# Patient Record
Sex: Male | Born: 1970 | Race: White | Hispanic: No | Marital: Married | Smoking: Former smoker
Health system: Southern US, Community
[De-identification: ages and names within clinical notes are randomized; demographics above are authoritative.]

## PROBLEM LIST (undated history)

## (undated) DIAGNOSIS — R339 Retention of urine, unspecified: Secondary | ICD-10-CM

## (undated) DIAGNOSIS — K567 Ileus, unspecified: Secondary | ICD-10-CM

## (undated) DIAGNOSIS — K9189 Other postprocedural complications and disorders of digestive system: Secondary | ICD-10-CM

## (undated) DIAGNOSIS — Z8659 Personal history of other mental and behavioral disorders: Secondary | ICD-10-CM

## (undated) HISTORY — PX: SINOSCOPY: SHX187

## (undated) HISTORY — PX: NASAL SEPTUM SURGERY: SHX37

---

## 2004-07-29 ENCOUNTER — Ambulatory Visit: Payer: Self-pay | Admitting: Internal Medicine

## 2004-08-03 ENCOUNTER — Emergency Department (HOSPITAL_COMMUNITY): Admission: EM | Admit: 2004-08-03 | Discharge: 2004-08-03 | Payer: Self-pay

## 2004-08-05 ENCOUNTER — Ambulatory Visit: Payer: Self-pay | Admitting: Internal Medicine

## 2004-08-29 ENCOUNTER — Ambulatory Visit: Payer: Self-pay | Admitting: Internal Medicine

## 2004-09-04 ENCOUNTER — Ambulatory Visit: Payer: Self-pay | Admitting: Psychiatry

## 2004-09-04 ENCOUNTER — Other Ambulatory Visit (HOSPITAL_COMMUNITY): Admission: RE | Admit: 2004-09-04 | Discharge: 2004-09-04 | Payer: Self-pay | Admitting: Psychiatry

## 2004-09-05 ENCOUNTER — Ambulatory Visit: Payer: Self-pay | Admitting: Psychiatry

## 2004-09-05 ENCOUNTER — Inpatient Hospital Stay (HOSPITAL_COMMUNITY): Admission: RE | Admit: 2004-09-05 | Discharge: 2004-09-07 | Payer: Self-pay | Admitting: Psychiatry

## 2004-09-09 ENCOUNTER — Other Ambulatory Visit (HOSPITAL_COMMUNITY): Admission: RE | Admit: 2004-09-09 | Discharge: 2004-12-08 | Payer: Self-pay | Admitting: Psychiatry

## 2005-03-04 ENCOUNTER — Ambulatory Visit (HOSPITAL_BASED_OUTPATIENT_CLINIC_OR_DEPARTMENT_OTHER): Admission: RE | Admit: 2005-03-04 | Discharge: 2005-03-04 | Payer: Self-pay | Admitting: Family Medicine

## 2005-03-09 ENCOUNTER — Ambulatory Visit: Payer: Self-pay | Admitting: Internal Medicine

## 2005-04-07 ENCOUNTER — Ambulatory Visit: Payer: Self-pay | Admitting: Pulmonary Disease

## 2005-04-09 ENCOUNTER — Ambulatory Visit: Payer: Self-pay | Admitting: Psychiatry

## 2005-04-09 ENCOUNTER — Other Ambulatory Visit (HOSPITAL_COMMUNITY): Admission: RE | Admit: 2005-04-09 | Discharge: 2005-04-11 | Payer: Self-pay | Admitting: Psychiatry

## 2005-11-10 ENCOUNTER — Encounter: Admission: RE | Admit: 2005-11-10 | Discharge: 2005-11-10 | Payer: Self-pay | Admitting: Family Medicine

## 2006-07-09 ENCOUNTER — Ambulatory Visit (HOSPITAL_BASED_OUTPATIENT_CLINIC_OR_DEPARTMENT_OTHER): Admission: RE | Admit: 2006-07-09 | Discharge: 2006-07-09 | Payer: Self-pay | Admitting: Otolaryngology

## 2006-12-31 ENCOUNTER — Ambulatory Visit (HOSPITAL_BASED_OUTPATIENT_CLINIC_OR_DEPARTMENT_OTHER): Admission: RE | Admit: 2006-12-31 | Discharge: 2006-12-31 | Payer: Self-pay | Admitting: Otolaryngology

## 2007-01-02 ENCOUNTER — Ambulatory Visit: Payer: Self-pay | Admitting: Pulmonary Disease

## 2008-06-06 ENCOUNTER — Ambulatory Visit: Payer: Self-pay | Admitting: Oncology

## 2008-06-22 LAB — CBC & DIFF AND RETIC
Basophils Absolute: 0 10*3/uL (ref 0.0–0.1)
Eosinophils Absolute: 0 10*3/uL (ref 0.0–0.5)
HGB: 12.1 g/dL — ABNORMAL LOW (ref 13.0–17.1)
LYMPH%: 30.4 % (ref 14.0–48.0)
MCV: 82.7 fL (ref 81.6–98.0)
MONO%: 11.9 % (ref 0.0–13.0)
NEUT#: 3 10*3/uL (ref 1.5–6.5)
Platelets: 204 10*3/uL (ref 145–400)
RETIC #: 43.2 10*3/uL (ref 31.8–103.9)

## 2008-06-24 LAB — FERRITIN: Ferritin: 14 ng/mL — ABNORMAL LOW (ref 22–322)

## 2008-06-24 LAB — COMPREHENSIVE METABOLIC PANEL
AST: 26 U/L (ref 0–37)
Albumin: 4.7 g/dL (ref 3.5–5.2)
BUN: 12 mg/dL (ref 6–23)
Calcium: 9.5 mg/dL (ref 8.4–10.5)
Chloride: 97 mEq/L (ref 96–112)
Potassium: 4.2 mEq/L (ref 3.5–5.3)
Sodium: 132 mEq/L — ABNORMAL LOW (ref 135–145)
Total Protein: 7 g/dL (ref 6.0–8.3)

## 2008-06-24 LAB — IRON AND TIBC: UIBC: 317 ug/dL

## 2008-06-24 LAB — TRANSFERRIN RECEPTOR, SOLUABLE: Transferrin Receptor, Soluble: 30.6 nmol/L

## 2008-07-20 ENCOUNTER — Ambulatory Visit: Payer: Self-pay | Admitting: Oncology

## 2008-07-27 LAB — CBC WITH DIFFERENTIAL/PLATELET
Basophils Absolute: 0 10*3/uL (ref 0.0–0.1)
EOS%: 0.5 % (ref 0.0–7.0)
HCT: 35.9 % — ABNORMAL LOW (ref 38.7–49.9)
HGB: 12.2 g/dL — ABNORMAL LOW (ref 13.0–17.1)
MCH: 29.8 pg (ref 28.0–33.4)
NEUT%: 53.4 % (ref 40.0–75.0)
lymph#: 1.7 10*3/uL (ref 0.9–3.3)

## 2008-07-29 LAB — COMPREHENSIVE METABOLIC PANEL
AST: 39 U/L — ABNORMAL HIGH (ref 0–37)
BUN: 11 mg/dL (ref 6–23)
CO2: 23 mEq/L (ref 19–32)
Calcium: 9 mg/dL (ref 8.4–10.5)
Chloride: 96 mEq/L (ref 96–112)
Creatinine, Ser: 0.79 mg/dL (ref 0.40–1.50)

## 2008-07-29 LAB — IRON AND TIBC
Iron: 48 ug/dL (ref 42–165)
UIBC: 223 ug/dL

## 2008-07-29 LAB — LACTATE DEHYDROGENASE: LDH: 188 U/L (ref 94–250)

## 2008-10-11 ENCOUNTER — Ambulatory Visit: Payer: Self-pay | Admitting: Oncology

## 2008-10-13 LAB — COMPREHENSIVE METABOLIC PANEL
Albumin: 4.5 g/dL (ref 3.5–5.2)
CO2: 29 mEq/L (ref 19–32)
Calcium: 9.3 mg/dL (ref 8.4–10.5)
Chloride: 99 mEq/L (ref 96–112)
Glucose, Bld: 86 mg/dL (ref 70–99)
Potassium: 4.6 mEq/L (ref 3.5–5.3)
Sodium: 134 mEq/L — ABNORMAL LOW (ref 135–145)
Total Protein: 6.6 g/dL (ref 6.0–8.3)

## 2008-10-13 LAB — IRON AND TIBC: %SAT: 42 % (ref 20–55)

## 2008-10-13 LAB — FERRITIN: Ferritin: 56 ng/mL (ref 22–322)

## 2008-10-13 LAB — CBC WITH DIFFERENTIAL/PLATELET
Eosinophils Absolute: 0 10*3/uL (ref 0.0–0.5)
HGB: 12.5 g/dL — ABNORMAL LOW (ref 13.0–17.1)
MONO#: 0.4 10*3/uL (ref 0.1–0.9)
NEUT#: 4.3 10*3/uL (ref 1.5–6.5)
RBC: 3.85 10*6/uL — ABNORMAL LOW (ref 4.20–5.82)
RDW: 15.2 % — ABNORMAL HIGH (ref 11.0–14.6)
WBC: 6.8 10*3/uL (ref 4.0–10.3)

## 2008-10-13 LAB — LACTATE DEHYDROGENASE: LDH: 186 U/L (ref 94–250)

## 2009-02-08 ENCOUNTER — Ambulatory Visit: Payer: Self-pay | Admitting: Oncology

## 2009-02-13 LAB — CBC WITH DIFFERENTIAL/PLATELET
BASO%: 0.4 % (ref 0.0–2.0)
Eosinophils Absolute: 0.1 10*3/uL (ref 0.0–0.5)
LYMPH%: 37.1 % (ref 14.0–49.0)
MCHC: 34.1 g/dL (ref 32.0–36.0)
MONO#: 0.4 10*3/uL (ref 0.1–0.9)
NEUT#: 2.5 10*3/uL (ref 1.5–6.5)
Platelets: 167 10*3/uL (ref 140–400)
RBC: 3.8 10*6/uL — ABNORMAL LOW (ref 4.20–5.82)
RDW: 13.3 % (ref 11.0–14.6)
WBC: 4.6 10*3/uL (ref 4.0–10.3)

## 2009-02-15 LAB — COMPREHENSIVE METABOLIC PANEL
ALT: 21 U/L (ref 0–53)
Albumin: 4.4 g/dL (ref 3.5–5.2)
Alkaline Phosphatase: 72 U/L (ref 39–117)
CO2: 27 mEq/L (ref 19–32)
Potassium: 4.8 mEq/L (ref 3.5–5.3)
Sodium: 136 mEq/L (ref 135–145)
Total Bilirubin: 0.6 mg/dL (ref 0.3–1.2)
Total Protein: 6.4 g/dL (ref 6.0–8.3)

## 2009-02-15 LAB — IRON AND TIBC
%SAT: 45 % (ref 20–55)
TIBC: 324 ug/dL (ref 215–435)

## 2009-02-15 LAB — LACTATE DEHYDROGENASE: LDH: 171 U/L (ref 94–250)

## 2009-08-14 ENCOUNTER — Ambulatory Visit: Payer: Self-pay | Admitting: Oncology

## 2009-08-16 LAB — CBC WITH DIFFERENTIAL/PLATELET
BASO%: 0.4 % (ref 0.0–2.0)
Basophils Absolute: 0 10*3/uL (ref 0.0–0.1)
EOS%: 0.8 % (ref 0.0–7.0)
HCT: 38.4 % (ref 38.4–49.9)
HGB: 13 g/dL (ref 13.0–17.1)
LYMPH%: 38.2 % (ref 14.0–49.0)
MCH: 32.5 pg (ref 27.2–33.4)
MCHC: 33.8 g/dL (ref 32.0–36.0)
MCV: 96.2 fL (ref 79.3–98.0)
NEUT%: 52.6 % (ref 39.0–75.0)
Platelets: 167 10*3/uL (ref 140–400)

## 2009-08-16 LAB — COMPREHENSIVE METABOLIC PANEL
ALT: 21 U/L (ref 0–53)
AST: 27 U/L (ref 0–37)
BUN: 11 mg/dL (ref 6–23)
Creatinine, Ser: 0.9 mg/dL (ref 0.40–1.50)
Total Bilirubin: 0.4 mg/dL (ref 0.3–1.2)

## 2009-08-16 LAB — LACTATE DEHYDROGENASE: LDH: 148 U/L (ref 94–250)

## 2009-08-20 LAB — IRON AND TIBC
%SAT: 23 % (ref 20–55)
TIBC: 289 ug/dL (ref 215–435)
UIBC: 223 ug/dL

## 2009-08-20 LAB — TRANSFERRIN RECEPTOR, SOLUABLE: Transferrin Receptor, Soluble: 13.2 nmol/L

## 2009-08-22 LAB — FECAL OCCULT BLOOD, GUAIAC: Occult Blood: POSITIVE

## 2009-10-26 ENCOUNTER — Encounter: Admission: RE | Admit: 2009-10-26 | Discharge: 2009-10-26 | Payer: Self-pay | Admitting: Gastroenterology

## 2010-02-12 ENCOUNTER — Ambulatory Visit: Payer: Self-pay | Admitting: Oncology

## 2010-02-14 LAB — CBC WITH DIFFERENTIAL/PLATELET
BASO%: 0.4 % (ref 0.0–2.0)
EOS%: 1.3 % (ref 0.0–7.0)
HCT: 34 % — ABNORMAL LOW (ref 38.4–49.9)
LYMPH%: 41.7 % (ref 14.0–49.0)
MCH: 33.1 pg (ref 27.2–33.4)
MCHC: 34.4 g/dL (ref 32.0–36.0)
MONO#: 0.3 10*3/uL (ref 0.1–0.9)
NEUT%: 49 % (ref 39.0–75.0)
Platelets: 145 10*3/uL (ref 140–400)
RBC: 3.54 10*6/uL — ABNORMAL LOW (ref 4.20–5.82)
WBC: 4.3 10*3/uL (ref 4.0–10.3)

## 2010-02-14 LAB — COMPREHENSIVE METABOLIC PANEL
ALT: 22 U/L (ref 0–53)
AST: 27 U/L (ref 0–37)
Alkaline Phosphatase: 64 U/L (ref 39–117)
BUN: 12 mg/dL (ref 6–23)
Calcium: 9.1 mg/dL (ref 8.4–10.5)
Chloride: 98 mEq/L (ref 96–112)
Creatinine, Ser: 0.98 mg/dL (ref 0.40–1.50)
Total Bilirubin: 0.8 mg/dL (ref 0.3–1.2)

## 2010-02-14 LAB — LACTATE DEHYDROGENASE: LDH: 153 U/L (ref 94–250)

## 2010-02-14 LAB — IRON AND TIBC
%SAT: 33 % (ref 20–55)
TIBC: 305 ug/dL (ref 215–435)
UIBC: 204 ug/dL

## 2010-07-11 ENCOUNTER — Encounter
Admission: RE | Admit: 2010-07-11 | Discharge: 2010-07-11 | Payer: Self-pay | Source: Home / Self Care | Attending: Family Medicine | Admitting: Family Medicine

## 2010-12-03 NOTE — Procedures (Signed)
NAME:  Tanner Blevins, Tanner Blevins         ACCOUNT NO.:  [id number]   MEDICAL RECORD NO.:  [id number]          PATIENT TYPE:  OUT   LOCATION:  SLEEP CENTER                 FACILITY:  [facility]   PHYSICIAN:  Barbaraann Share, MD,FCCPDATE OF BIRTH:  1972/04/03   DATE OF STUDY:  12/31/2006                            NOCTURNAL POLYSOMNOGRAM   REFERRING PHYSICIAN:  Suzanna Obey, M.D.   INDICATION FOR STUDY:  Hypersomnia with sleep apnea.   EPWORTH SLEEPINESS SCORE:  10.   MEDICATIONS:   SLEEP ARCHITECTURE:  The patient had a total sleep time of 444 minutes  with slow wave sleep but mildly decreased REM at 97 minutes.  Sleep  onset latency was normal and REM onset was mildly prolonged.  Sleep  efficiency was excellent at 97%.   RESPIRATORY DATA:  The patient was found to have 44 obstructive  hypopneas and 24 obstructive apneas for an apnea/hypopnea index of 9  events per hour.  The events were not positional but they were worse  during REM.  There was moderate to loud snoring noted throughout.  The  patient did not meet split night criteria secondary to the majority of  his events occurring after 2 AM.   OXYGEN DATA:  There was transient 02 desaturation as low as 70%, but no  consistent 02 desaturations of concern.   CARDIAC DATA:  No clinically significant cardiac arrhythmias were noted.   MOVEMENT-PARASOMNIA:  Very rare leg jerk noted without significant  clinical disruption of sleep.   IMPRESSIONS-RECOMMENDATIONS:  Mild obstructive sleep apnea/hypopnea  syndrome with an apnea/hypopnea index of 9 events per hour, 02  desaturation as low as 70%.  The patient did not meet split night  criteria secondary to most of his events occurring after 2 AM, and there  was only transient desaturation into the 70's  that was not consistent.  Treatment for this degree of sleep apnea may  include weight loss alone if applicable, upper airway surgery, oral  appliance and also CPAP.  Clinical correlation is  suggested.      Barbaraann Share, MD,FCCP  Diplomate, American Board of Sleep  Medicine  Electronically Signed     [facility]/MEDQ  D:  01/13/2007 17:38:33  T:  01/14/2007 08:10:31  Job:  [id number]

## 2010-12-06 NOTE — Discharge Summary (Signed)
NAMEEHAN, FREAS         ACCOUNT NO.:  [id number]   MEDICAL RECORD NO.:  [id number]          PATIENT TYPE:  IPS   LOCATION:  0307                          FACILITY:  [facility]   PHYSICIAN:  Geoffery Lyons, M.D.      DATE OF BIRTH:  1970/12/25   DATE OF ADMISSION:  09/05/2004  DATE OF DISCHARGE:  09/07/2004                                 DISCHARGE SUMMARY   CHIEF COMPLAINT AND PRESENT ILLNESS:  This was first admission to [facility] for this 40 year old white married male referred by  intensive outpatient clinic, seeing psychotherapy for suicidal thoughts.  Family and IOP feared he would take an overdose of Klonopin. History of  depression. No prior suicidal attempt. Started on a diet six months prior to  his admission, became obsessed, focusing on food, dropped more than 30  pounds, out of control, __________, passive interval, waking at 4 in the  morning.   PAST PSYCHIATRIC HISTORY:  Sees Dr. Donell Beers and Britta Mccreedy __________ for  psychotherapy. First time inpatient. Suffered depression as a teenager.   ALCOHOL AND DRUG HISTORY:  Denies use of or abuse of any substances.   PAST MEDICAL HISTORY:  Anorexia.   MEDICATIONS:  1.  Lexapro 15 mg per day.  2.  Klonopin 1 mg at night.   PHYSICAL EXAMINATION:  Physical exam performed, did not show any acute  findings.   LABORATORY DATA:  Hemoglobin 13.0, hematocrit 37.4. Blood chemistries:  Glucose 100. Liver enzymes within normal limits. TSH 1.682. Drug screening  negative for substances of abuse.   MENTAL STATUS EXAM:  Reveals a fully alert male, anxious. Broken eye contact  but cooperative. Speech is normal rate and production. Mood depressed,  anxious. Affect depressed, anxious. Thought process somewhat circumstantial.  Positive for suicidal ideation, passive, no delusional ideas. No homicidal  ideas. Worries, concerns, no hallucinations. Cognition well preserved.   ADMISSION DIAGNOSES:   AXIS I:  1.  Major  depressive, recurrent.  2.  Anxiety disorder, not otherwise specified.  3.  Obsessive compulsive disorder.   AXIS II:  No diagnosis.   AXIS III:  Anorexia.   AXIS IV:  Moderate.   AXIS V:  Global Assessment of Functioning upon admission 20-25, highest  Global Assessment of Functioning in the last year 70.   COURSE IN THE HOSPITAL:  He was admitted and started in individual and group  psychotherapy. He was given Ambien for sleep. He was maintained on Klonopin  1 mg at night, Lexapro 10 mg per day. He was placed on Risperdal 0.25 at  noon and 0.5 at night, Klonopin 0.5 twice a day and 1 mg at night, Lexapro  15 mg per day. He settled down. He was very insightful, aware of his  compulsive behaviors, his anorexia, excessive exercise, increased anxiety,  and knows that the anorexia and exercise help to contain the anxiety but  understood they were out o control. Obsessed over calorie counting and  energy expenditure. Endorsed that he was having some suicidal ideation but  would act on those suicidal ideas. Endorses history of compulsive behaviors  and anxiety. Felt better. Felt  medication was trying to kick in. He felt  that he could safely go home. Family had no concerns and discharged to IOP.   DISCHARGE DIAGNOSES:   AXIS I:  1.  Depressive disorder, not otherwise specified.  2.  Eating disorder, not otherwise specified.  3.  Obsessive compulsive disorder.   AXIS II:  No diagnosis.   AXIS III:  No diagnosis.   AXIS IV:  Moderate.   AXIS V:  Global Assessment of Functioning upon discharge 55.   DISCHARGE MEDICATIONS:  1.  Risperdal 0.25 one at noontime and two at night.  2.  Klonopin 0.5 twice a day and two at night.  3.  Lexapro 10 mg 1-1/2 daily.  4.  Ambien 10 at bedtime for sleep.   FOLLOW UP:  Follow up with [facility].      IL/MEDQ  D:  10/08/2004  T:  10/09/2004  Job:  [id number]

## 2010-12-06 NOTE — Op Note (Signed)
NAME:  Tanner Blevins, Tanner Blevins         ACCOUNT NO.:  [id number]   MEDICAL RECORD NO.:  [id number]          PATIENT TYPE:  AMB   LOCATION:  DSC                          FACILITY:  [facility]   PHYSICIAN:  Suzanna Obey, M.D.       DATE OF BIRTH:  05-Oct-1970   DATE OF PROCEDURE:  07/09/2006  DATE OF DISCHARGE:                               OPERATIVE REPORT   PREOPERATIVE DIAGNOSES:  Deviated septum and turbinate hypertrophy.   POSTOPERATIVE DIAGNOSES:  Deviated septum and turbinate hypertrophy.   SURGICAL PROCEDURE:  Septoplasty and submucous resection of inferior  turbinates.   SURGEON:  Suzanna Obey, M.D.   ANESTHESIA:  General.   ESTIMATED BLOOD LOSS:  Less than 10 cc.   INDICATIONS:  This is a 40 year old, who has had significant nasal  obstruction and congestion that has failed medical therapy.  He has a  deviation of his septum and is ready to proceed with surgery.  He was  informed of the risks and benefits of the procedure, including bleeding,  infection, perforation of the septum, change in the external appearance  of the nose, chronic crusting and drying, numbness of the teeth, and  risk of the anesthetic.  All questions were answered and consent was  obtained.   OPERATION:  The patient was taken to the operating room and placed in  supine position.  After adequate general endotracheal tube anesthesia,  he was placed in a supine position and prepped and draped in the usual  sterile manner.  Oxymetazoline pledgets were placed into the nose  bilaterally, and the septum and inferior turbinates were injected with  1% lidocaine with 1:100,000 epinephrine.  A left hemitransfixion  incision was performed, raising a mucoperichondrial and ostial flap.  Cartilage was divided about 2 cm posterior to the caudal strut, and this  deviated portion of the cartilage was removed.  The opposite flap was  elevated.  The bony portion of the septum was removed with a Leane Para-  Middleton forceps.  The  inferior spur was removed with a 4-mm osteotome.  This corrected the septal deflection.  The turbinates were infractured.  A midline incision was made with a 15 blade.  Mucosal flap was elevated  superiorly, and the inferior mucosa and bone were removed with a  turbinate scissors.  The edge was cauterized with suction cautery.  The  flap was laid back down over the raw surface, and both turbinates were  outfractured.  The hemitransfixion incision was closed with interrupted  4-0 chromic, and a 4-0 plain gut quilting stitch placed in the septum.  Telfa rolls soaked in Bacitracin were placed into the nose bilaterally  and secured with a 3-0 nylon.  The oral cavity and oropharynx were  suctioned out of all blood and debris under direct visualization.  The  patient was awakened and brought to recovery in stable condition.  Counts correct.           ______________________________  Suzanna Obey, M.D.    JB/MEDQ  D:  07/09/2006  T:  07/09/2006  Job:  [id number]
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
## 2013-04-15 ENCOUNTER — Inpatient Hospital Stay (HOSPITAL_COMMUNITY)
Admission: EM | Admit: 2013-04-15 | Discharge: 2013-04-18 | DRG: 883 | Disposition: A | Discharge status: Home / Self Care | Payer: BC Managed Care – PPO | Attending: Surgery | Admitting: Surgery

## 2013-04-15 ENCOUNTER — Encounter (HOSPITAL_COMMUNITY): Admission: EM | Disposition: A | Discharge status: Home / Self Care | Payer: Self-pay | Source: Home / Self Care

## 2013-04-15 ENCOUNTER — Ambulatory Visit
Admission: RE | Admit: 2013-04-15 | Discharge: 2013-04-15 | Disposition: A | Discharge status: Home / Self Care | Payer: BC Managed Care – PPO | Source: Ambulatory Visit | Attending: Family Medicine | Admitting: Family Medicine

## 2013-04-15 ENCOUNTER — Encounter (HOSPITAL_COMMUNITY): Payer: Self-pay | Admitting: Certified Registered"

## 2013-04-15 ENCOUNTER — Other Ambulatory Visit: Payer: Self-pay | Admitting: Family Medicine

## 2013-04-15 ENCOUNTER — Emergency Department (HOSPITAL_COMMUNITY): Payer: BC Managed Care – PPO | Admitting: Certified Registered"

## 2013-04-15 ENCOUNTER — Encounter (HOSPITAL_COMMUNITY): Payer: Self-pay | Admitting: General Practice

## 2013-04-15 DIAGNOSIS — K3580 Unspecified acute appendicitis: Secondary | ICD-10-CM

## 2013-04-15 DIAGNOSIS — Y836 Removal of other organ (partial) (total) as the cause of abnormal reaction of the patient, or of later complication, without mention of misadventure at the time of the procedure: Secondary | ICD-10-CM | POA: Diagnosis not present

## 2013-04-15 DIAGNOSIS — IMO0002 Reserved for concepts with insufficient information to code with codable children: Secondary | ICD-10-CM | POA: Diagnosis not present

## 2013-04-15 DIAGNOSIS — K560 Paralytic ileus: Secondary | ICD-10-CM | POA: Diagnosis not present

## 2013-04-15 DIAGNOSIS — R1031 Right lower quadrant pain: Secondary | ICD-10-CM

## 2013-04-15 DIAGNOSIS — K929 Disease of digestive system, unspecified: Secondary | ICD-10-CM | POA: Diagnosis not present

## 2013-04-15 DIAGNOSIS — R339 Retention of urine, unspecified: Secondary | ICD-10-CM | POA: Diagnosis not present

## 2013-04-15 DIAGNOSIS — F509 Eating disorder, unspecified: Secondary | ICD-10-CM | POA: Diagnosis present

## 2013-04-15 DIAGNOSIS — K567 Ileus, unspecified: Secondary | ICD-10-CM | POA: Diagnosis not present

## 2013-04-15 DIAGNOSIS — R109 Unspecified abdominal pain: Secondary | ICD-10-CM

## 2013-04-15 DIAGNOSIS — N9989 Other postprocedural complications and disorders of genitourinary system: Secondary | ICD-10-CM | POA: Diagnosis not present

## 2013-04-15 HISTORY — DX: Ileus, unspecified: K56.7

## 2013-04-15 HISTORY — DX: Other postprocedural complications and disorders of digestive system: K91.89

## 2013-04-15 HISTORY — DX: Personal history of other mental and behavioral disorders: Z86.59

## 2013-04-15 HISTORY — DX: Retention of urine, unspecified: R33.9

## 2013-04-15 HISTORY — PX: LAPAROSCOPIC APPENDECTOMY: SHX408

## 2013-04-15 SURGERY — APPENDECTOMY, LAPAROSCOPIC
Anesthesia: General | Site: Abdomen | Wound class: Contaminated

## 2013-04-15 MED ORDER — KCL IN DEXTROSE-NACL 30-5-0.45 MEQ/L-%-% IV SOLN
INTRAVENOUS | Status: DC
  Administered 2013-04-16 – 2013-04-17 (×4): via INTRAVENOUS
  Filled 2013-04-15 (×6): qty 1000

## 2013-04-15 MED ORDER — ARMODAFINIL 250 MG PO TABS: 250.0000 mg | ORAL_TABLET | Freq: Every day | ORAL | Status: DC

## 2013-04-15 MED ORDER — AMPICILLIN-SULBACTAM SODIUM 3 (2-1) G IJ SOLR
3.0000 g | Freq: Four times a day (QID) | INTRAMUSCULAR | Status: DC
  Administered 2013-04-16 – 2013-04-18 (×10): 3 g via INTRAVENOUS
  Filled 2013-04-15 (×13): qty 3

## 2013-04-15 MED ORDER — ONDANSETRON HCL 4 MG PO TABS: 4.0000 mg | ORAL_TABLET | Freq: Four times a day (QID) | ORAL | Status: DC | PRN

## 2013-04-15 MED ORDER — LACTATED RINGERS IV SOLN
INTRAVENOUS | Status: DC | PRN
  Administered 2013-04-15: 1000 mL via INTRAVENOUS

## 2013-04-15 MED ORDER — BUPIVACAINE-EPINEPHRINE PF 0.25-1:200000 % IJ SOLN
INTRAMUSCULAR | Status: AC
  Filled 2013-04-15: qty 30

## 2013-04-15 MED ORDER — GLYCOPYRROLATE 0.2 MG/ML IJ SOLN
INTRAMUSCULAR | Status: DC | PRN
  Administered 2013-04-15: 0.6 mg via INTRAVENOUS

## 2013-04-15 MED ORDER — NEOSTIGMINE METHYLSULFATE 1 MG/ML IJ SOLN
INTRAMUSCULAR | Status: DC | PRN
  Administered 2013-04-15: 5 mg via INTRAVENOUS

## 2013-04-15 MED ORDER — SERTRALINE HCL 50 MG PO TABS
150.0000 mg | ORAL_TABLET | Freq: Every day | ORAL | Status: DC
  Administered 2013-04-16 – 2013-04-18 (×3): 150 mg via ORAL
  Filled 2013-04-15 (×3): qty 1

## 2013-04-15 MED ORDER — FENTANYL CITRATE 0.05 MG/ML IJ SOLN
INTRAMUSCULAR | Status: DC | PRN
  Administered 2013-04-15: 100 ug via INTRAVENOUS
  Administered 2013-04-15 (×3): 50 ug via INTRAVENOUS

## 2013-04-15 MED ORDER — HYDROCODONE-ACETAMINOPHEN 5-325 MG PO TABS
1.0000 | ORAL_TABLET | ORAL | Status: DC | PRN
  Administered 2013-04-16 – 2013-04-18 (×6): 2 via ORAL
  Filled 2013-04-15 (×6): qty 2

## 2013-04-15 MED ORDER — HYDROMORPHONE HCL PF 1 MG/ML IJ SOLN
INTRAMUSCULAR | Status: AC
  Filled 2013-04-15: qty 1

## 2013-04-15 MED ORDER — HYDROMORPHONE HCL PF 1 MG/ML IJ SOLN
0.2500 mg | INTRAMUSCULAR | Status: DC | PRN
Start: 2013-04-15 — End: 2013-04-15
  Administered 2013-04-15 (×2): 0.5 mg via INTRAVENOUS

## 2013-04-15 MED ORDER — ONDANSETRON HCL 4 MG/2ML IJ SOLN
INTRAMUSCULAR | Status: DC | PRN
  Administered 2013-04-15: 4 mg via INTRAVENOUS

## 2013-04-15 MED ORDER — AMPHETAMINE-DEXTROAMPHETAMINE 10 MG PO TABS
10.0000 mg | ORAL_TABLET | Freq: Every day | ORAL | Status: DC
  Filled 2013-04-15: qty 1

## 2013-04-15 MED ORDER — SUCCINYLCHOLINE CHLORIDE 20 MG/ML IJ SOLN
INTRAMUSCULAR | Status: DC | PRN
  Administered 2013-04-15: 100 mg via INTRAVENOUS

## 2013-04-15 MED ORDER — ONDANSETRON HCL 4 MG/2ML IJ SOLN: 4.0000 mg | Freq: Four times a day (QID) | INTRAMUSCULAR | Status: DC | PRN

## 2013-04-15 MED ORDER — DIAZEPAM 5 MG PO TABS
5.0000 mg | ORAL_TABLET | Freq: Every morning | ORAL | Status: DC
  Administered 2013-04-16 – 2013-04-18 (×3): 5 mg via ORAL
  Filled 2013-04-15 (×3): qty 1

## 2013-04-15 MED ORDER — LACTATED RINGERS IV SOLN
INTRAVENOUS | Status: DC
  Administered 2013-04-15: 21:00:00 via INTRAVENOUS

## 2013-04-15 MED ORDER — LACTATED RINGERS IV SOLN
INTRAVENOUS | Status: DC | PRN
  Administered 2013-04-15: 19:00:00 via INTRAVENOUS

## 2013-04-15 MED ORDER — MIDAZOLAM HCL 5 MG/5ML IJ SOLN
INTRAMUSCULAR | Status: DC | PRN
  Administered 2013-04-15: 2 mg via INTRAVENOUS

## 2013-04-15 MED ORDER — BUPIVACAINE-EPINEPHRINE 0.25% -1:200000 IJ SOLN
INTRAMUSCULAR | Status: DC | PRN
  Administered 2013-04-15: 15 mL

## 2013-04-15 MED ORDER — IBUPROFEN 600 MG PO TABS
600.0000 mg | ORAL_TABLET | Freq: Four times a day (QID) | ORAL | Status: DC | PRN
  Filled 2013-04-15: qty 1

## 2013-04-15 MED ORDER — SODIUM CHLORIDE 0.9 % IV SOLN
INTRAVENOUS | Status: AC
  Filled 2013-04-15: qty 3

## 2013-04-15 MED ORDER — BUPROPION HCL 100 MG PO TABS
100.0000 mg | ORAL_TABLET | Freq: Every day | ORAL | Status: DC
  Administered 2013-04-16 – 2013-04-17 (×2): 100 mg via ORAL
  Filled 2013-04-15 (×4): qty 1

## 2013-04-15 MED ORDER — SODIUM CHLORIDE 0.9 % IV SOLN
3.0000 g | Freq: Once | INTRAVENOUS | Status: AC
  Administered 2013-04-15: 3 g via INTRAVENOUS

## 2013-04-15 MED ORDER — PROPOFOL 10 MG/ML IV BOLUS
INTRAVENOUS | Status: DC | PRN
  Administered 2013-04-15: 200 mg via INTRAVENOUS

## 2013-04-15 MED ORDER — ROCURONIUM BROMIDE 100 MG/10ML IV SOLN
INTRAVENOUS | Status: DC | PRN
  Administered 2013-04-15: 30 mg via INTRAVENOUS
  Administered 2013-04-15: 20 mg via INTRAVENOUS

## 2013-04-15 MED ORDER — HYDROMORPHONE HCL PF 1 MG/ML IJ SOLN
1.0000 mg | INTRAMUSCULAR | Status: DC | PRN
  Administered 2013-04-15 – 2013-04-16 (×2): 1 mg via INTRAVENOUS
  Filled 2013-04-15: qty 1

## 2013-04-15 MED ORDER — IOHEXOL 300 MG/ML  SOLN
125.0000 mL | Freq: Once | INTRAMUSCULAR | Status: AC | PRN
  Administered 2013-04-15: 125 mL via INTRAVENOUS

## 2013-04-15 SURGICAL SUPPLY — 36 items
APPLIER CLIP ROT 10 11.4 M/L (STAPLE)
BENZOIN TINCTURE PRP APPL 2/3 (GAUZE/BANDAGES/DRESSINGS) ×2 IMPLANT
CANISTER SUCTION 2500CC (MISCELLANEOUS) ×2 IMPLANT
CLIP APPLIE ROT 10 11.4 M/L (STAPLE) IMPLANT
CLOSURE STERI-STRIP 1/4X4 (GAUZE/BANDAGES/DRESSINGS) ×2 IMPLANT
CLOTH BEACON ORANGE TIMEOUT ST (SAFETY) ×2 IMPLANT
CUTTER FLEX LINEAR 45M (STAPLE) ×2 IMPLANT
DECANTER SPIKE VIAL GLASS SM (MISCELLANEOUS) ×2 IMPLANT
DRAPE LAPAROSCOPIC ABDOMINAL (DRAPES) ×2 IMPLANT
ELECT REM PT RETURN 9FT ADLT (ELECTROSURGICAL) ×2
ELECTRODE REM PT RTRN 9FT ADLT (ELECTROSURGICAL) ×1 IMPLANT
ENDOLOOP SUT PDS II  0 18 (SUTURE)
ENDOLOOP SUT PDS II 0 18 (SUTURE) IMPLANT
GLOVE BIOGEL PI IND STRL 7.0 (GLOVE) ×1 IMPLANT
GLOVE BIOGEL PI INDICATOR 7.0 (GLOVE) ×1
GLOVE SURG ORTHO 8.0 STRL STRW (GLOVE) ×2 IMPLANT
GOWN PREVENTION PLUS LG XLONG (DISPOSABLE) ×2 IMPLANT
GOWN STRL REIN XL XLG (GOWN DISPOSABLE) ×4 IMPLANT
KIT BASIN OR (CUSTOM PROCEDURE TRAY) ×2 IMPLANT
PENCIL BUTTON HOLSTER BLD 10FT (ELECTRODE) ×2 IMPLANT
POUCH SPECIMEN RETRIEVAL 10MM (ENDOMECHANICALS) ×2 IMPLANT
RELOAD 45 VASCULAR/THIN (ENDOMECHANICALS) IMPLANT
RELOAD STAPLE TA45 3.5 REG BLU (ENDOMECHANICALS) ×2 IMPLANT
SCALPEL HARMONIC ACE (MISCELLANEOUS) ×2 IMPLANT
SET IRRIG TUBING LAPAROSCOPIC (IRRIGATION / IRRIGATOR) ×2 IMPLANT
SOLUTION ANTI FOG 6CC (MISCELLANEOUS) ×2 IMPLANT
STRIP CLOSURE SKIN 1/2X4 (GAUZE/BANDAGES/DRESSINGS) ×2 IMPLANT
SUT MNCRL AB 4-0 PS2 18 (SUTURE) ×2 IMPLANT
TOWEL OR 17X26 10 PK STRL BLUE (TOWEL DISPOSABLE) ×2 IMPLANT
TRAY FOLEY CATH 14FRSI W/METER (CATHETERS) ×2 IMPLANT
TRAY LAP CHOLE (CUSTOM PROCEDURE TRAY) ×2 IMPLANT
TROCAR BLADELESS OPT 5 75 (ENDOMECHANICALS) ×2 IMPLANT
TROCAR XCEL BLUNT TIP 100MML (ENDOMECHANICALS) ×2 IMPLANT
TROCAR XCEL NON-BLD 11X100MML (ENDOMECHANICALS) ×2 IMPLANT
TUBING INSUFFLATION 10FT LAP (TUBING) ×2 IMPLANT
WATER STERILE IRR 1500ML POUR (IV SOLUTION) ×2 IMPLANT

## 2013-04-15 NOTE — Op Note (Signed)
OPERATIVE REPORT - LAPAROSCOPIC APPENDECTOMY  Preop diagnosis: Acute appendicitis  Postop diagnosis: Same  Procedure: Laparoscopic appendectomy  Surgeon:  Velora Heckler, MD, FACS  Anesthesia: General endotracheal  Estimated blood loss: Minimal  Preparation: Chlora-prep  Complications: None  Indications:  Patient is a 42 yo WM with 36 hr hx of abdominal pain localizing to the RLQ.  Seen by primary MD and sent for CTA which confirmed acute appendicitis.  Now prepared for OR for appendectomy.  Procedure:  Patient is brought to the operating room and placed in a supine position on the operating room table. Following administration of general anesthesia, a time out was held and the patient's name and type of procedure is confirmed. Patient is then prepped and draped in the usual strict aseptic fashion.  After ascertaining that an adequate level of anesthesia been achieved, a peri-umbilical incision is made with a #15 blade. Dissection is carried down to the fascia. Fascia is incised in the midline and the peritoneal cavity is entered cautiously. A #0-vicryl pursestring suture is placed in the fascia. An Hassan cannula is introduced under direct vision and secured with the pursestring suture. Abdomen is insufflated with carbon dioxide. Laparoscope is introduced and the abdomen is explored. Operative ports are placed in the right upper quadrant and left lower quadrant. Appendix is identified. Mesoappendix is divided with the harmonic scalpel. Dissection is carried down to the base of the appendix. The base of the appendix at its junction with the cecal wall was clearly identified. Using an Endo GIA stapler the base of the appendix is transected at its junction with the cecal wall. There is good approximation of tissue along the staple line. There is good hemostasis along the staple line. The appendix is placed into an endo-catch bag and withdrawn through the umbilical port without difficulty. The  #0-vicryl pursestring suture is tied securely.  Right lower quadrant is irrigated with warm saline which is evacuated. Good hemostasis is noted. Ports are removed under direct vision. Good hemostasis is noted at the port sites. Pneumoperitoneum is released.  Skin incisions are anesthetized with local anesthetic. Wounds are closed with interrupted 4-0 Monocryl subcuticular sutures. Wounds were washed and dried and benzoin and Steri-Strips are applied. Sterile dressings are applied. Patient is awakened from anesthesia and brought to the recovery room. The patient tolerated the procedure well.  Velora Heckler, MD, [facility] Office: [phone]

## 2013-04-15 NOTE — ED Notes (Signed)
Pt sent from pcp, had CT scan today which confirms appendicitis. Pt to have surgery with Dr. Daphine Deutscher

## 2013-04-15 NOTE — ED Notes (Signed)
Bed: WA04 Expected date:  Expected time:  Means of arrival:  Comments: Hold for appendicitis in triage

## 2013-04-15 NOTE — ED Notes (Signed)
ED Secretary called Dr. Sid Falcon to come see pt.

## 2013-04-15 NOTE — ED Notes (Signed)
Pt states he last ate food yesterday morning. Last PO intake was around noon today.

## 2013-04-15 NOTE — Anesthesia Postprocedure Evaluation (Signed)
  Anesthesia Post-op Note  Patient: Tanner Blevins  Procedure(s) Performed: Procedure(s): APPENDECTOMY LAPAROSCOPIC (N/A)  Patient is awake and responsive. Pain and nausea are reasonably well controlled. Vital signs are stable and clinically acceptable. Oxygen saturation is clinically acceptable. There are no apparent anesthetic complications at this time. Patient is ready for discharge.

## 2013-04-15 NOTE — H&P (Signed)
Tanner Blevins is an 42 y.o. male.    Chief Complaint: acute appendicitis  HPI: patient is a 42 yo WM with 36 hr hx of abdominal pain localizing to the RLQ.  Nausea and chills.  No emesis.  Increasing pain today localized to RLQ.  Saw primary MD and CTA confirms acute appendicitis.  Sent to ER for surgical consultation and management.  No prior abdominal surgery.  Under care of psychiatrist for treatment of eating disorder.  Allergic to Tegretol.  Denies allergies to antibiotics or pain medication.  No past medical history on file.  No past surgical history on file.  No family history on file. Social History:  has no tobacco, alcohol, and drug history on file.  Allergies:  Allergies  Allergen Reactions  . Carbamazepine Other (See Comments)    anemia     (Not in a hospital admission)  No results found for this or any previous visit (from the past 48 hour(s)). Ct Abdomen Pelvis W Contrast  04/15/2013   CLINICAL DATA:  Right lower quadrant abdominal pain which began yesterday. Nausea.  EXAM: CT ABDOMEN AND PELVIS WITH CONTRAST  TECHNIQUE: Multidetector CT imaging of the abdomen and pelvis was performed using the standard protocol following bolus administration of intravenous contrast.  CONTRAST:  OMNIPAQUE IOHEXOL 300 MG/ML IV. Oral contrast was also administered.  COMPARISON:  None.  FINDINGS: Dilated, fluid filled distal appendix with moderate periappendiceal inflammation, maximum diameter of the appendix approximately 14 mm. No evidence of abscess. No extraluminal gas. Secondary inflammation of the distal ileum with localized wall thickening, but no evidence of small bowel obstruction. Entire colon normal in appearance. Normal appearing stomach. No ascites.  Normal appearing liver, spleen, pancreas, adrenal glands, kidneys, and gallbladder. No biliary ductal dilation. No visible aortoiliofemoral atherosclerosis. No significant lymphadenopathy.  Urinary bladder decompressed and  unremarkable. Prostate gland and seminal vesicles normal in appearance. Phlebolith low in the right side of the pelvis.  Bone window images demonstrate minimal degenerative changes in the lumbar spine. Visualized lung bases clear apart from minimal dependent atelectasis in the left lower lobe. Heart size normal.  IMPRESSION: 1. Acute appendicitis without evidence of periappendiceal abscess or perforation. 2. Secondary inflammation of a short segment of the adjacent distal ileum. Small bowel otherwise normal in appearance without evidence of obstruction. 3. Examination otherwise normal.  Critical value/emergent results were called by telephone at the time of interpretation on 04/15/2013 at 4:29 PM to [facility] in Dr. Randel Books office, who verbally acknowledged these results.   Electronically Signed   By: Hulan Saas   On: 04/15/2013 16:35    Review of Systems  Constitutional: Positive for chills, malaise/fatigue and diaphoresis. Negative for fever and weight loss.  HENT: Negative.   Eyes: Negative.   Respiratory: Negative.   Cardiovascular: Negative.   Gastrointestinal: Positive for nausea and abdominal pain. Negative for vomiting, diarrhea and constipation.  Genitourinary: Negative.   Musculoskeletal: Negative.   Skin: Negative.   Neurological: Negative.  Negative for weakness.  Endo/Heme/Allergies: Negative.   Psychiatric/Behavioral: Negative.     Blood pressure 132/88, pulse 94, temperature 98.9 F (37.2 C), temperature source Oral, resp. rate 20, SpO2 99.00%. Physical Exam  Constitutional: He is oriented to person, place, and time. He appears well-developed and well-nourished. He appears distressed.  HENT:  Head: Normocephalic and atraumatic.  Right Ear: External ear normal.  Left Ear: External ear normal.  Eyes: Conjunctivae are normal. Pupils are equal, round, and reactive to light. No scleral icterus.  Neck: Normal  range of motion. Neck supple. No thyromegaly present.   Cardiovascular: Normal rate, regular rhythm and normal heart sounds.   No murmur heard. Respiratory: Effort normal and breath sounds normal. He has no wheezes. He exhibits no tenderness.  GI: Soft. He exhibits distension. He exhibits no mass. There is tenderness. There is rebound and guarding.  Tender to palpation RLQ with guarding  Musculoskeletal: Normal range of motion. He exhibits no edema.  Lymphadenopathy:    He has no cervical adenopathy.  Neurological: He is alert and oriented to person, place, and time.  Skin: Skin is warm and dry. He is not diaphoretic.  Psychiatric: He has a normal mood and affect. His behavior is normal.     Assessment/Plan Acute appendicitis  Plan lap appy this evening.  Discussed with patient and wife and step-mother at bedside.  The risks and benefits of the procedure have been discussed at length with the patient.  The patient understands the proposed procedure, potential alternative treatments, and the course of recovery to be expected.  All of the patient's questions have been answered at this time.  The patient wishes to proceed with surgery.  Velora Heckler, MD, [facility] Office: [phone]    Cuinn Westerhold Judie Petit 04/15/2013, 7:07 PM

## 2013-04-15 NOTE — Transfer of Care (Signed)
Immediate Anesthesia Transfer of Care Note  Patient: Tanner Blevins  Procedure(s) Performed: Procedure(s) (LRB): APPENDECTOMY LAPAROSCOPIC (N/A)  Patient Location: PACU  Anesthesia Type: General  Level of Consciousness: sedated, patient cooperative and responds to stimulation  Airway & Oxygen Therapy: Patient Spontanous Breathing and Patient connected to face mask oxgen  Post-op Assessment: Report given to PACU RN and Post -op Vital signs reviewed and stable  Post vital signs: Reviewed and stable  Complications: No apparent anesthesia complications

## 2013-04-15 NOTE — ED Notes (Signed)
MD at bedside. Dr. Gerrit Friends at bedside.

## 2013-04-15 NOTE — Preoperative (Signed)
Beta Blockers   Reason not to administer Beta Blockers:Not Applicable 

## 2013-04-15 NOTE — Anesthesia Preprocedure Evaluation (Addendum)
Anesthesia Evaluation  Patient identified by MRN, date of birth, ID band Patient awake    Reviewed: Allergy & Precautions, H&P , Patient's Chart, lab work & pertinent test results, reviewed documented beta blocker date and time   Airway Mallampati: II TM Distance: >3 FB Neck ROM: full    Dental no notable dental hx.    Pulmonary sleep apnea and Continuous Positive Airway Pressure Ventilation ,  breath sounds clear to auscultation  Pulmonary exam normal       Cardiovascular Rhythm:regular Rate:Normal     Neuro/Psych    GI/Hepatic   Endo/Other    Renal/GU      Musculoskeletal   Abdominal   Peds  Hematology   Anesthesia Other Findings Clinically well, runs and is fit, no complaints until Sx of acute appy. Not ADD, but eating disorder. NPO for today  Reproductive/Obstetrics                         Anesthesia Physical Anesthesia Plan  ASA: II and emergent  Anesthesia Plan: General   Post-op Pain Management:    Induction: Intravenous  Airway Management Planned: Oral ETT  Additional Equipment:   Intra-op Plan:   Post-operative Plan: Extubation in OR  Informed Consent: I have reviewed the patients History and Physical, chart, labs and discussed the procedure including the risks, benefits and alternatives for the proposed anesthesia with the patient or authorized representative who has indicated his/her understanding and acceptance.   Dental Advisory Given and Dental advisory given  Plan Discussed with: CRNA and Surgeon  Anesthesia Plan Comments: (  Discussed general anesthesia, including possible nausea, instrumentation of airway, sore throat,pulmonary aspiration, etc. I asked if the were any outstanding questions, or  concerns before we proceeded. )        Anesthesia Quick Evaluation

## 2013-04-16 ENCOUNTER — Encounter (HOSPITAL_COMMUNITY): Payer: Self-pay | Admitting: *Deleted

## 2013-04-16 MED ORDER — KETOROLAC TROMETHAMINE 15 MG/ML IJ SOLN
15.0000 mg | Freq: Four times a day (QID) | INTRAMUSCULAR | Status: DC
  Administered 2013-04-16 – 2013-04-18 (×8): 15 mg via INTRAVENOUS
  Filled 2013-04-16 (×14): qty 1

## 2013-04-16 NOTE — Progress Notes (Signed)
Paged MD on call.  Pt had previously had two consecutive I&O caths and still not able to void.  Let MD know how much urine had previously been returned.  MD stated that if he was unable to void after the 8 hours, to place a foley instead of doing an I&O cath.  Will continue to monitor.  Will encourage pt to try to void on his own.  Sherron Monday

## 2013-04-16 NOTE — Progress Notes (Signed)
Pt unable to void, bladder scan completed showing urine in bladder.  I&O cath completed using sterile technique.  1175 ml of clear concentrated urine obtained.  Pt tolerated procedure well.

## 2013-04-16 NOTE — Progress Notes (Signed)
Pt unable to void, bladder scan completed showing 486 ml urine in bladder. I&O cath completed using sterile technique. 850 ml of clear concentrated urine obtained. Pt tolerated procedure well.

## 2013-04-16 NOTE — Progress Notes (Signed)
Patient ID: Tanner Blevins, male   DOB: 11-Jan-1971, 42 y.o.   MRN: [id number]  General Surgery - [facility] - Progress Note  POD# 1  Subjective: Patient with moderate pain, sweats.  Unable to void and catheterized for over 1100cc this AM.  Objective: Vital signs in last 24 hours: Temp:  [98.6 F (37 C)-99.7 F (37.6 C)] 99.4 F (37.4 C) (09/27 0529) Pulse Rate:  [87-103] 102 (09/27 0529) Resp:  [14-21] 18 (09/27 0529) BP: (116-143)/(72-88) 116/72 mmHg (09/27 0529) SpO2:  [96 %-100 %] 97 % (09/27 0529) Last BM Date:  (pta)  Intake/Output from previous day: 09/26 0701 - 09/27 0700 In: 1747.5 [I.V.:1547.5; IV Piggyback:200] Out: 150 [Urine:150]  Exam: HEENT - clear, not icteric Neck - soft Chest - clear bilaterally Cor - RRR, no murmur Abd - moderate distension, difuse tenderness; few BS; dressings dry and intact Ext - no significant edema Neuro - grossly intact, no focal deficits  Lab Results:  No results found for this basename: WBC, HGB, HCT, PLT,  in the last 72 hours  No results found for this basename: NA, K, CL, CO2, GLUCOSE, BUN, CREATININE, CALCIUM,  in the last 72 hours  Studies/Results: Ct Abdomen Pelvis W Contrast  04/15/2013   CLINICAL DATA:  Right lower quadrant abdominal pain which began yesterday. Nausea.  EXAM: CT ABDOMEN AND PELVIS WITH CONTRAST  TECHNIQUE: Multidetector CT imaging of the abdomen and pelvis was performed using the standard protocol following bolus administration of intravenous contrast.  CONTRAST:  OMNIPAQUE IOHEXOL 300 MG/ML IV. Oral contrast was also administered.  COMPARISON:  None.  FINDINGS: Dilated, fluid filled distal appendix with moderate periappendiceal inflammation, maximum diameter of the appendix approximately 14 mm. No evidence of abscess. No extraluminal gas. Secondary inflammation of the distal ileum with localized wall thickening, but no evidence of small bowel obstruction. Entire colon normal in  appearance. Normal appearing stomach. No ascites.  Normal appearing liver, spleen, pancreas, adrenal glands, kidneys, and gallbladder. No biliary ductal dilation. No visible aortoiliofemoral atherosclerosis. No significant lymphadenopathy.  Urinary bladder decompressed and unremarkable. Prostate gland and seminal vesicles normal in appearance. Phlebolith low in the right side of the pelvis.  Bone window images demonstrate minimal degenerative changes in the lumbar spine. Visualized lung bases clear apart from minimal dependent atelectasis in the left lower lobe. Heart size normal.  IMPRESSION: 1. Acute appendicitis without evidence of periappendiceal abscess or perforation. 2. Secondary inflammation of a short segment of the adjacent distal ileum. Small bowel otherwise normal in appearance without evidence of obstruction. 3. Examination otherwise normal.  Critical value/emergent results were called by telephone at the time of interpretation on 04/15/2013 at 4:29 PM to [facility] in Dr. Randel Books office, who verbally acknowledged these results.   Electronically Signed   By: Hulan Saas   On: 04/15/2013 16:35    Assessment / Plan: 1.  Status post lap appendectomy  IV Unasyn  Pain Rx  Add Toradol  Ambulate  Clear liquids 2.  Urinary retention  Monitor UOP  May need Foley  Velora Heckler, MD, [facility] Office: [phone]  04/16/2013

## 2013-04-17 NOTE — Progress Notes (Signed)
Spoke with patient about CPAP. He states at this time he does not want to wear it tonight. I explained how important it was and told him to notify me if he changed his mind. RT will continue to monitor.

## 2013-04-17 NOTE — Progress Notes (Signed)
Pt still unable to void 8 hours after previous I&O.  81F foley placed.  Bladder scanner showed .  After foley was placed, 400ccs returned.  Will continue to monitor.  Tanner Blevins

## 2013-04-17 NOTE — Progress Notes (Signed)
Patient ID: Tanner Blevins, male   DOB: 1970-11-05, 42 y.o.   MRN: [id number] [facility] Progress Note:   2 Days Post-Op  Subjective: Mental status is clear.  Sitting up in chair with Foley in place.  No flautus Objective: Vital signs in last 24 hours: Temp:  [98 F (36.7 C)-98.4 F (36.9 C)] 98 F (36.7 C) (09/28 0500) Pulse Rate:  [76-80] 80 (09/28 0500) Resp:  [18] 18 (09/28 0500) BP: (115-148)/(74-84) 115/78 mmHg (09/28 0500) SpO2:  [96 %-98 %] 98 % (09/28 0500) Weight:  [250 lb (113.399 kg)] 250 lb (113.399 kg) (09/27 1318)  Intake/Output from previous day: 09/27 0701 - 09/28 0700 In: 2660 [P.O.:600; I.V.:1660; IV Piggyback:400] Out: 4950 [Urine:4950] Intake/Output this shift:    Physical Exam: Work of breathing is not labored.  Abdomen is distended-no flatus or BM.  Feels distended.    Lab Results:  No results found for this or any previous visit (from the past 48 hour(s)).  Radiology/Results: Ct Abdomen Pelvis W Contrast  04/15/2013   CLINICAL DATA:  Right lower quadrant abdominal pain which began yesterday. Nausea.  EXAM: CT ABDOMEN AND PELVIS WITH CONTRAST  TECHNIQUE: Multidetector CT imaging of the abdomen and pelvis was performed using the standard protocol following bolus administration of intravenous contrast.  CONTRAST:  OMNIPAQUE IOHEXOL 300 MG/ML IV. Oral contrast was also administered.  COMPARISON:  None.  FINDINGS: Dilated, fluid filled distal appendix with moderate periappendiceal inflammation, maximum diameter of the appendix approximately 14 mm. No evidence of abscess. No extraluminal gas. Secondary inflammation of the distal ileum with localized wall thickening, but no evidence of small bowel obstruction. Entire colon normal in appearance. Normal appearing stomach. No ascites.  Normal appearing liver, spleen, pancreas, adrenal glands, kidneys, and gallbladder. No biliary ductal dilation. No visible aortoiliofemoral atherosclerosis. No  significant lymphadenopathy.  Urinary bladder decompressed and unremarkable. Prostate gland and seminal vesicles normal in appearance. Phlebolith low in the right side of the pelvis.  Bone window images demonstrate minimal degenerative changes in the lumbar spine. Visualized lung bases clear apart from minimal dependent atelectasis in the left lower lobe. Heart size normal.  IMPRESSION: 1. Acute appendicitis without evidence of periappendiceal abscess or perforation. 2. Secondary inflammation of a short segment of the adjacent distal ileum. Small bowel otherwise normal in appearance without evidence of obstruction. 3. Examination otherwise normal.  Critical value/emergent results were called by telephone at the time of interpretation on 04/15/2013 at 4:29 PM to [facility] in Dr. Randel Books office, who verbally acknowledged these results.   Electronically Signed   By: Hulan Saas   On: 04/15/2013 16:35    Anti-infectives: Anti-infectives   Start     Dose/Rate Route Frequency Ordered Stop   04/16/13 0200  Ampicillin-Sulbactam (UNASYN) 3 g in sodium chloride 0.9 % 100 mL IVPB     3 g 100 mL/hr over 60 Minutes Intravenous Every 6 hours 04/15/13 2313     04/15/13 1930  Ampicillin-Sulbactam (UNASYN) 3 g in sodium chloride 0.9 % 100 mL IVPB     3 g 100 mL/hr over 60 Minutes Intravenous  Once 04/15/13 1921 04/15/13 1945      Assessment/Plan: Problem List: Patient Active Problem List   Diagnosis Date Noted  . Appendicitis, acute 04/15/2013    Ileus postop and unable to sustain on PO liquids.  Urinary retention requiring Foley.  Will keep in until ileus resolved and taking adequate PO liquids.  2 Days Post-Op    LOS: 2 days  Matt B. Daphine Deutscher, MD, [facility] [phone] beeper [phone]  04/17/2013 10:07 AM

## 2013-04-18 ENCOUNTER — Encounter (HOSPITAL_COMMUNITY): Payer: Self-pay | Admitting: General Surgery

## 2013-04-18 ENCOUNTER — Encounter: Payer: Self-pay | Admitting: General Surgery

## 2013-04-18 DIAGNOSIS — K9189 Other postprocedural complications and disorders of digestive system: Secondary | ICD-10-CM

## 2013-04-18 DIAGNOSIS — K567 Ileus, unspecified: Secondary | ICD-10-CM

## 2013-04-18 DIAGNOSIS — R339 Retention of urine, unspecified: Secondary | ICD-10-CM | POA: Diagnosis not present

## 2013-04-18 HISTORY — DX: Other postprocedural complications and disorders of digestive system: K91.89

## 2013-04-18 HISTORY — DX: Retention of urine, unspecified: R33.9

## 2013-04-18 HISTORY — DX: Ileus, unspecified: K56.7

## 2013-04-18 MED ORDER — TAMSULOSIN HCL 0.4 MG PO CAPS
0.4000 mg | ORAL_CAPSULE | Freq: Every day | ORAL | Status: DC
  Administered 2013-04-18: 0.4 mg via ORAL
  Filled 2013-04-18: qty 1

## 2013-04-18 MED ORDER — ACETAMINOPHEN 325 MG PO TABS: ORAL_TABLET | ORAL | Status: DC

## 2013-04-18 MED ORDER — HYDROCODONE-ACETAMINOPHEN 5-325 MG PO TABS: 1.0000 | ORAL_TABLET | ORAL | Status: DC | PRN

## 2013-04-18 MED ORDER — ENOXAPARIN SODIUM 40 MG/0.4ML ~~LOC~~ SOLN
40.0000 mg | SUBCUTANEOUS | Status: DC
  Administered 2013-04-18: 40 mg via SUBCUTANEOUS
  Filled 2013-04-18: qty 0.4

## 2013-04-18 MED ORDER — IBUPROFEN 200 MG PO TABS: ORAL_TABLET | ORAL | Status: DC

## 2013-04-18 NOTE — Progress Notes (Signed)
3 Days Post-Op  Subjective: Pt feels much better today, no N/V.  Abdominal distension/pain much improved today.  Ambulating well through the halls.  Still with catheter.  Tolerating clear liquids well.  Objective: Vital signs in last 24 hours: Temp:  [98 F (36.7 C)-100.2 F (37.9 C)] 99.4 F (37.4 C) (09/29 0549) Pulse Rate:  [74-87] 82 (09/29 0549) Resp:  [18-20] 18 (09/29 0549) BP: (117-133)/(73-74) 133/74 mmHg (09/29 0549) SpO2:  [95 %-97 %] 97 % (09/29 0549) Last BM Date:  (pta)  Intake/Output from previous day: 09/28 0701 - 09/29 0700 In: 2620 [P.O.:420; I.V.:1800; IV Piggyback:400] Out: 3100 [Urine:3100] Intake/Output this shift:    PE: Gen:  Alert, NAD, pleasant Abd: Soft, mild tenderness, ND, +BS, no HSM, incisions C/D/I, minimal erythema inferior and around umbilical incision (likely reaction from tape)   Lab Results:  No results found for this basename: WBC, HGB, HCT, PLT,  in the last 72 hours BMET No results found for this basename: NA, K, CL, CO2, GLUCOSE, BUN, CREATININE, CALCIUM,  in the last 72 hours PT/INR No results found for this basename: LABPROT, INR,  in the last 72 hours CMP     Component Value Date/Time   NA 133* 02/14/2010 1447   K 4.4 02/14/2010 1447   CL 98 02/14/2010 1447   CO2 31 02/14/2010 1447   GLUCOSE 78 02/14/2010 1447   BUN 12 02/14/2010 1447   CREATININE 0.98 02/14/2010 1447   CALCIUM 9.1 02/14/2010 1447   PROT 6.1 02/14/2010 1447   ALBUMIN 4.2 02/14/2010 1447   AST 27 02/14/2010 1447   ALT 22 02/14/2010 1447   ALKPHOS 64 02/14/2010 1447   BILITOT 0.8 02/14/2010 1447   Lipase  No results found for this basename: lipase       Studies/Results: No results found.  Anti-infectives: Anti-infectives   Start     Dose/Rate Route Frequency Ordered Stop   04/16/13 0200  Ampicillin-Sulbactam (UNASYN) 3 g in sodium chloride 0.9 % 100 mL IVPB     3 g 100 mL/hr over 60 Minutes Intravenous Every 6 hours 04/15/13 2313     04/15/13 1930   Ampicillin-Sulbactam (UNASYN) 3 g in sodium chloride 0.9 % 100 mL IVPB     3 g 100 mL/hr over 60 Minutes Intravenous  Once 04/15/13 1921 04/15/13 1945       Assessment/Plan POD #3 s/p lap appy for appendicitis Post-op Ileus Urinary retention  1.  IVF, pain control, antiemetics 2.  Advance diet to fulls at lunch, then reg diet at dinner if tolerating 3.  SCD's and lovenox 4.  Ambulate and IS 5.  D/c foley, add flomax 6.  Watch erythema around umbilical wound 7.  If urinating on own and tolerating regular diet tomorrow can go home    LOS: 3 days    Aris [place] 04/18/2013, 8:33 AM Pager: [phone]

## 2013-04-18 NOTE — Discharge Summary (Signed)
Physician Discharge Summary  Patient ID: Tanner Blevins MRN: [id number] DOB/AGE: 04-02-1971 42 y.o.  Admit date: 04/15/2013 Discharge date: 04/18/2013  Admission Diagnoses: Acute appendictis  Discharge Diagnoses: Acute appendicits Post op urinary retention Post op ileus Principal Problem:   Appendicitis, acute Active Problems:   Urinary retention with incomplete bladder emptying   Ileus, postoperative   PROCEDURES: LAPAROSCOPIC APPENDECTOMY, 04/15/2013, Velora Heckler, MD   Hospital Course: patient is a 42 yo WM with 36 hr hx of abdominal pain localizing to the RLQ. Nausea and chills. No emesis. Increasing pain today localized to RLQ. Saw primary MD and CTA confirms acute appendicitis. Sent to ER for surgical consultation and management.  No prior abdominal surgery. Under care of psychiatrist for treatment of eating disorder. He was admitted from the ER and taken to surgery, for the above procedure.  He did well from the surgery, but post op had some issues with voiding, and ileus. After I/O cath x 2 a foley was placed on 9/18.  He has been able to advance his PO intake today, and if he does well with a regular diet we hope to send him home after lunch.  He is anxious to go home and has voided a couple times after foley removal without difficulty.  Condition on d/c:  Improved.   Disposition:        Future Appointments Provider Department Dept Phone   05/10/2013 3:00 PM [facility] [place] [phone]       Medication List         acetaminophen 325 MG tablet  Commonly known as:  TYLENOL  Do not take more than 4000 mg of tylenol (acetaminophen) in any 24 hours period.     amphetamine-dextroamphetamine 10 MG tablet  Commonly known as:  ADDERALL  Take 10 mg by mouth daily.     buPROPion 100 MG tablet  Commonly known as:  WELLBUTRIN  Take 100 mg by mouth daily.     diazepam 5 MG tablet  Commonly known as:  VALIUM  Take 5 mg by  mouth every morning.     HYDROcodone-acetaminophen 5-325 MG per tablet  Commonly known as:  NORCO/VICODIN  Take 1-2 tablets by mouth every 4 (four) hours as needed.     ibuprofen 200 MG tablet  Commonly known as:  ADVIL,MOTRIN  You can take 2-3 tablets every 6 hours as needed for pain.     NUVIGIL 250 MG tablet  Generic drug:  Armodafinil  Take 250 mg by mouth daily.     ZOLOFT 100 MG tablet  Generic drug:  sertraline  Take 150 mg by mouth daily.       Follow-up Information   Follow up with [facility] [place] On 05/10/2013. (Be at the office at 2:30 for check in.  Your appointment is at 3:00PM)    Contact information:   [address]   [place] [address] [phone]       Signed: Sherrie George 04/18/2013, 1:17 PM

## 2013-04-26 ENCOUNTER — Telehealth (INDEPENDENT_AMBULATORY_CARE_PROVIDER_SITE_OTHER): Payer: Self-pay

## 2013-04-26 ENCOUNTER — Encounter (INDEPENDENT_AMBULATORY_CARE_PROVIDER_SITE_OTHER): Payer: Self-pay

## 2013-04-26 NOTE — Telephone Encounter (Signed)
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 Pt states he is too sore and requests to have rtw letter changed to rtw date of 05-02-13. Letter complete at front desk for pick up.

## 2013-05-10 ENCOUNTER — Encounter (INDEPENDENT_AMBULATORY_CARE_PROVIDER_SITE_OTHER): Payer: Self-pay | Admitting: General Surgery

## 2013-05-10 ENCOUNTER — Ambulatory Visit (INDEPENDENT_AMBULATORY_CARE_PROVIDER_SITE_OTHER): Payer: BC Managed Care – PPO | Admitting: General Surgery

## 2013-05-10 VITALS — BP 130/84 | HR 88 | Temp 97.0°F | Resp 18 | Ht 71.0 in | Wt 243.2 lb

## 2013-05-10 DIAGNOSIS — K3580 Unspecified acute appendicitis: Secondary | ICD-10-CM

## 2013-05-10 NOTE — Patient Instructions (Signed)
Follow up as needed

## 2013-05-10 NOTE — Progress Notes (Signed)
Tanner Blevins 14-May-1971 [id number] 05/10/2013   History of Present Illness: Tanner Blevins is a  42 y.o. male who presents today status post lap appy by Dr. Gerrit Friends.  Pathology reveals acute appendicitis and periappendicitis.  The patient is tolerating a regular diet, having normal bowel movements, has good pain control.  He  is back to most normal activities.   Physical Exam: Abd: soft, nontender, active bowel sounds, nondistended.  All incisions are well healed.  Impression: 1.  Acute appendicitis, s/p lap appy  Plan: He  is able to return to normal activities. He  may follow up on a prn basis.

## 2014-05-03 IMAGING — CT CT ABD-PELV W/ CM
3 of 5 series · 14 of 32 positions shown, 19 images · IV contrast (omnipaque)
Comparison: None.

CLINICAL DATA: Right lower quadrant abdominal pain which began
yesterday. Nausea.

EXAM:
CT ABDOMEN AND PELVIS WITH CONTRAST
TECHNIQUE: Multidetector CT imaging of the abdomen and pelvis was performed
using the standard protocol following bolus administration of
intravenous contrast.
CONTRAST:  125mL OMNIPAQUE IOHEXOL 300 MG/ML IV. Oral contrast was
also administered.

[Series 2: abd/pelvis with · axial · 0.82mm/px · z∈[-425,-140]mm · 4 of 96 slices shown, 9 images]
[im 20/96  soft-tissue]
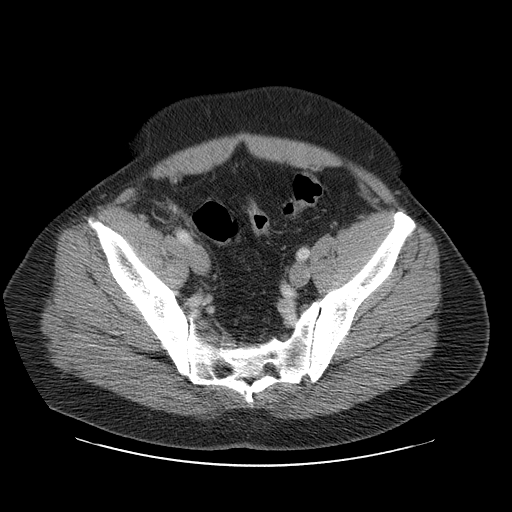
[im 20/96  lung]
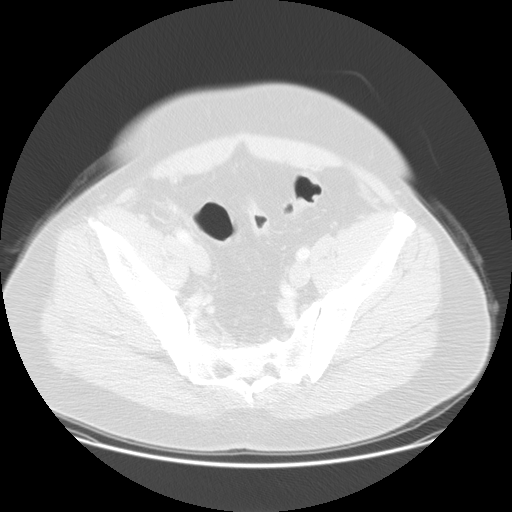
[im 20/96  bone]
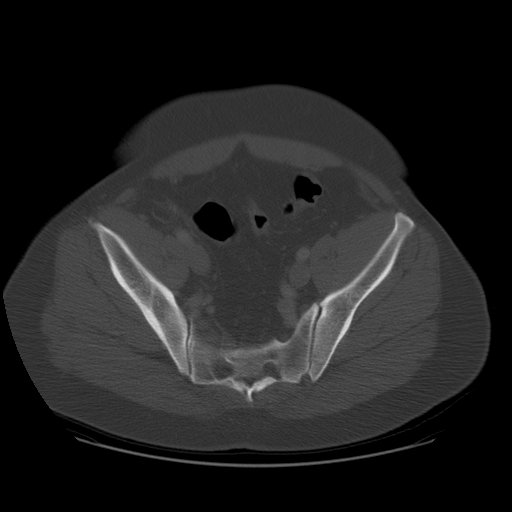
[im 39/96  soft-tissue]
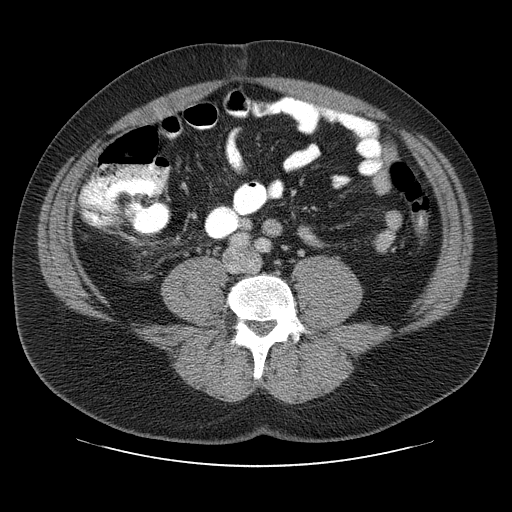
[im 39/96  lung]
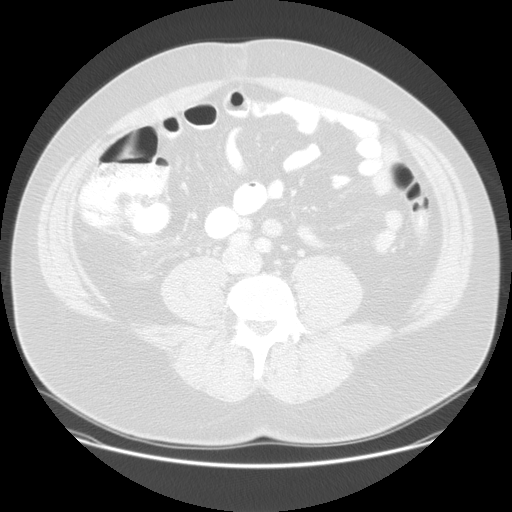
[im 58/96  soft-tissue]
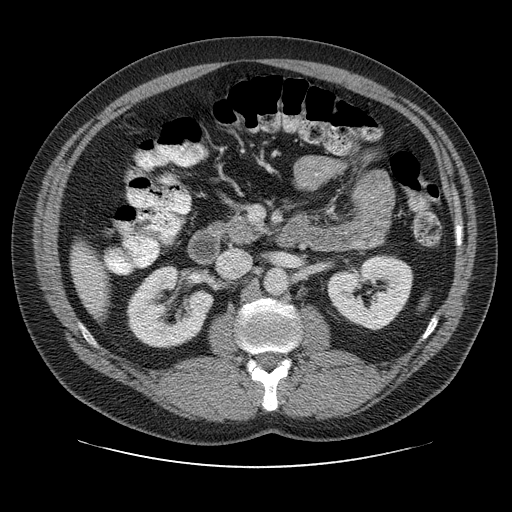
[im 58/96  lung]
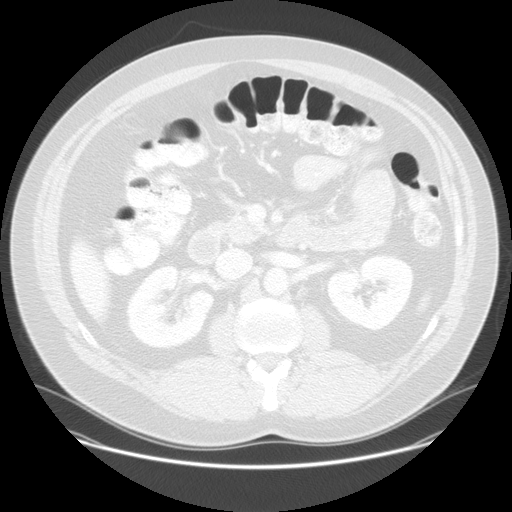
[im 77/96  soft-tissue]
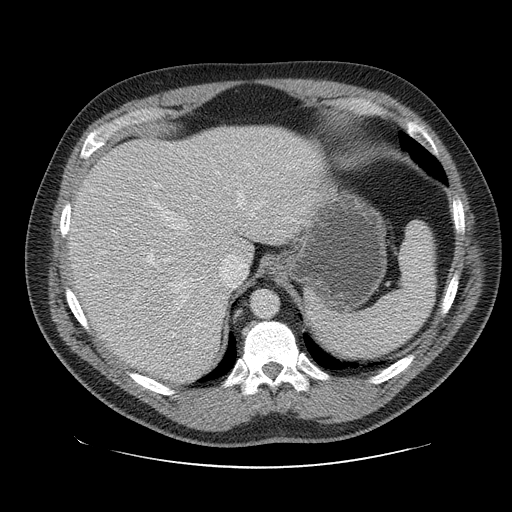
[im 77/96  lung]
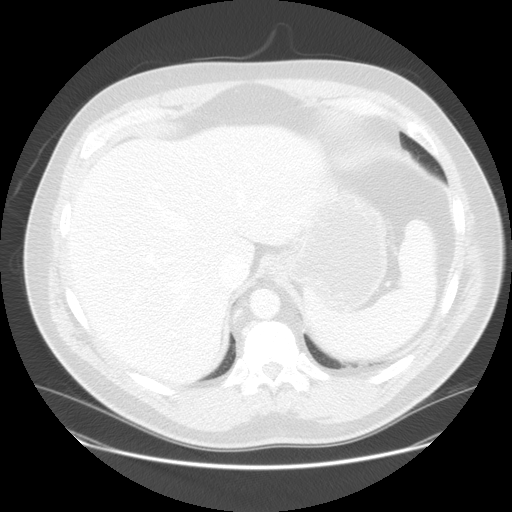

[Series 400: sagittal · sagittal · 0.95mm/px · 8 of 167 slices shown]
[im 16/167  soft-tissue]
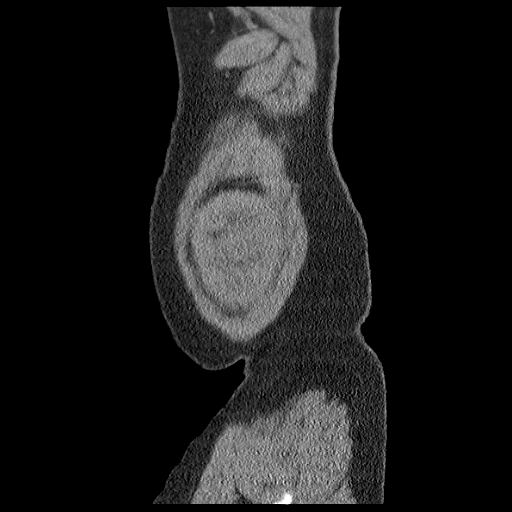
[im 31/167  soft-tissue]
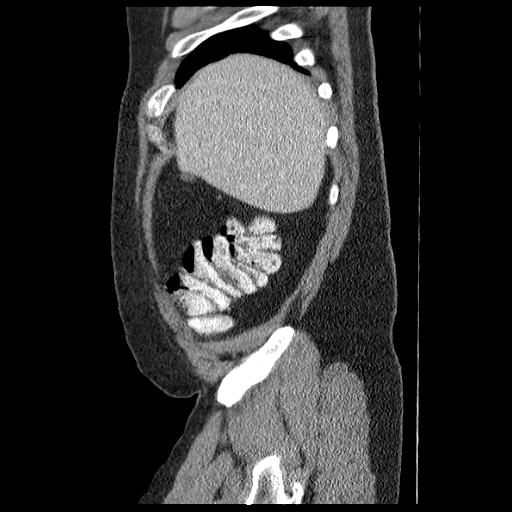
[im 61/167  soft-tissue]
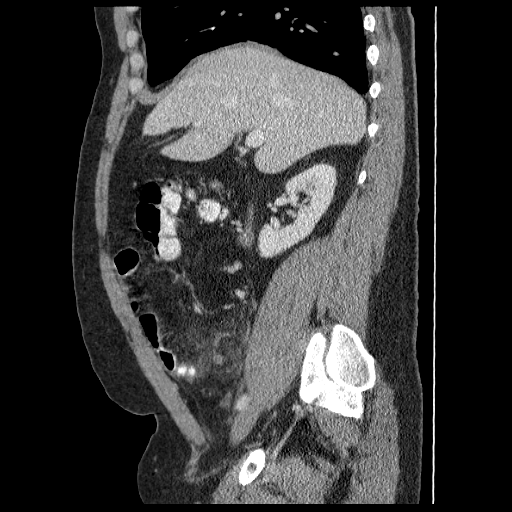
[im 76/167  soft-tissue]
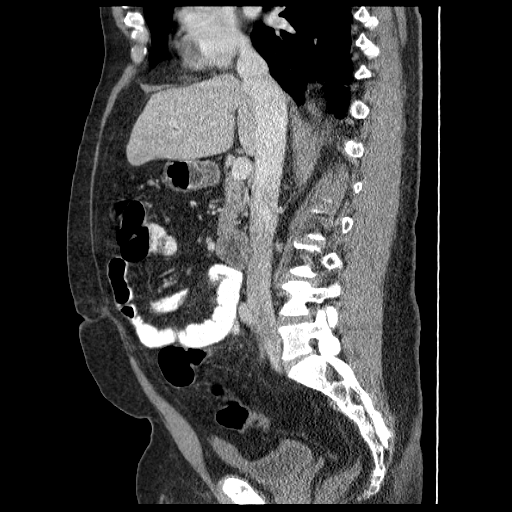
[im 91/167  soft-tissue]
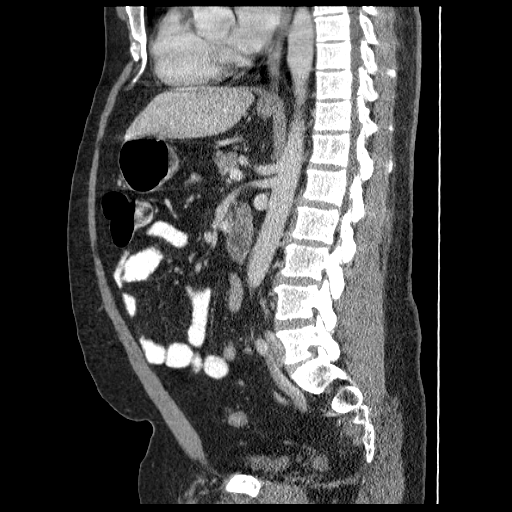
[im 106/167  soft-tissue]
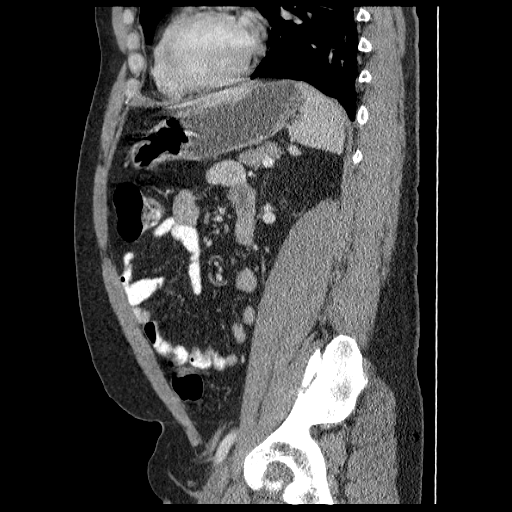
[im 136/167  soft-tissue]
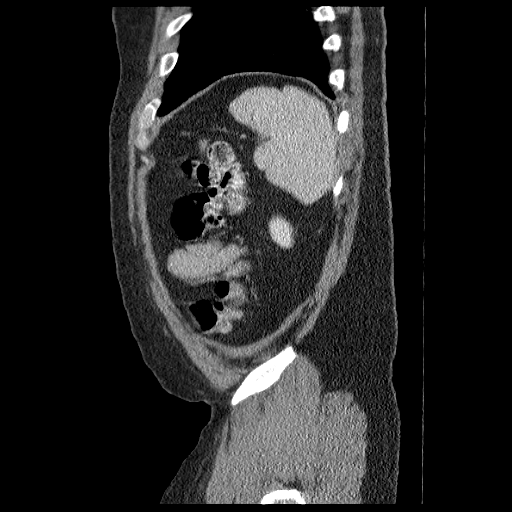
[im 151/167  soft-tissue]
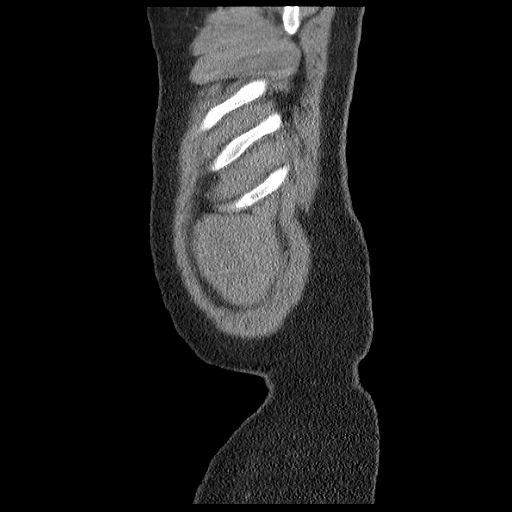

[Series 401: coronal · coronal · 0.95mm/px · 2 of 136 slices shown]
[im 16/136  soft-tissue]
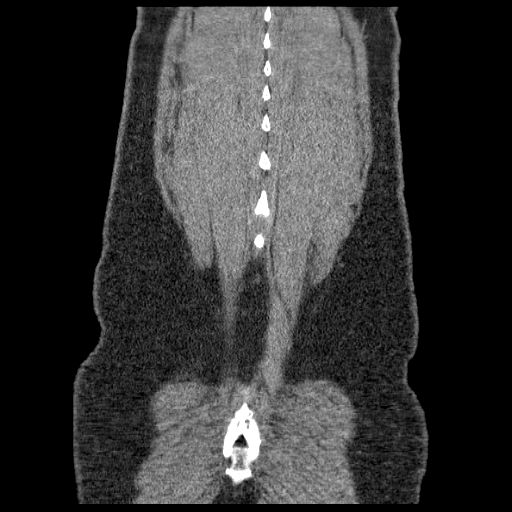
[im 31/136  soft-tissue]
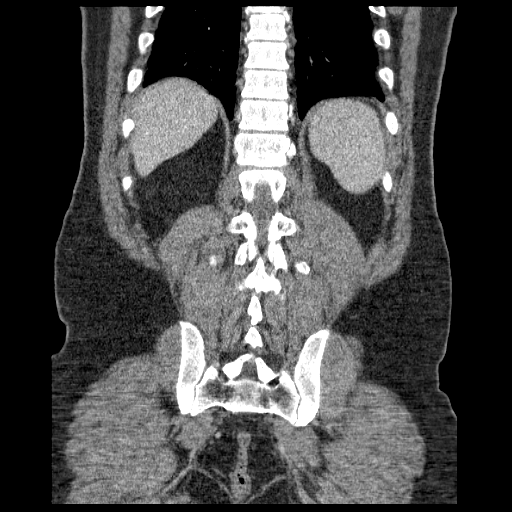

[14 of 32 positions shown; findings below may reference images not displayed]

FINDINGS: Dilated, fluid filled distal appendix with moderate periappendiceal
inflammation, maximum diameter of the appendix approximately 14 mm.
No evidence of abscess. No extraluminal gas. Secondary inflammation
of the distal ileum with localized wall thickening, but no evidence
of small bowel obstruction. Entire colon normal in appearance.
Normal appearing stomach. No ascites.

Normal appearing liver, spleen, pancreas, adrenal glands, kidneys,
and gallbladder. No biliary ductal dilation. No visible
aortoiliofemoral atherosclerosis. No significant lymphadenopathy.

Urinary bladder decompressed and unremarkable. Prostate gland and
seminal vesicles normal in appearance. Phlebolith low in the right
side of the pelvis.

Bone window images demonstrate minimal degenerative changes in the
lumbar spine. Visualized lung bases clear apart from minimal
dependent atelectasis in the left lower lobe. Heart size normal.
IMPRESSION: 1. Acute appendicitis without evidence of periappendiceal abscess or
perforation.
2. Secondary inflammation of a short segment of the adjacent distal
ileum. Small bowel otherwise normal in appearance without evidence
of obstruction.
3. Examination otherwise normal.

Critical value/emergent results were called by telephone at the time
of interpretation on 04/15/2013 at [DATE] to [place] in Dr. [REDACTED], who verbally acknowledged these results.

## 2016-09-08 ENCOUNTER — Ambulatory Visit (INDEPENDENT_AMBULATORY_CARE_PROVIDER_SITE_OTHER): Payer: BC Managed Care – PPO | Admitting: Allergy & Immunology

## 2016-09-08 ENCOUNTER — Encounter: Payer: Self-pay | Admitting: Allergy & Immunology

## 2016-09-08 VITALS — BP 112/80 | HR 74 | Temp 98.1°F | Resp 16 | Ht 69.5 in | Wt 236.6 lb

## 2016-09-08 DIAGNOSIS — J3089 Other allergic rhinitis: Secondary | ICD-10-CM

## 2016-09-08 MED ORDER — AZELASTINE-FLUTICASONE 137-50 MCG/ACT NA SUSP: 1.0000 | Freq: Two times a day (BID) | NASAL | 5 refills | Status: DC

## 2016-09-08 MED ORDER — MONTELUKAST SODIUM 10 MG PO TABS: 10.0000 mg | ORAL_TABLET | Freq: Every day | ORAL | 5 refills | Status: DC

## 2016-09-08 NOTE — Progress Notes (Signed)
NEW PATIENT  Date of Service/Encounter:  09/08/16  Referring provider: Thora Lance, MD   Assessment:    Chronic non-seasonal allergic rhinitis (trees, grasses, ragweed, weeds, molds, cat, dog, dust mite)    Plan/Recommendations:   1. Chronic non-seasonal allergic rhinitis - Testing today showed: positives to beech pollen, pecan pollen, grass mix, ragweed mix, weed mix, molds, cat, dog, and dust mite - Avoidance measures provided.  - Start Dymista two sprays per nostril 1-2 times daily. - Continue with Zyrtec (cetirizine) 10mg  daily. - Start Singulair 10mg  daily. - Information on allergy shots provided. - Please call your insurance company to confirm coverage and then call us back to let us know if you are interested in starting them.  2. Return in about 3 months (around 12/06/2016).   Subjective:   Tanner Blevins is a 46 y.o. male presenting today for evaluation of  Chief Complaint  Patient presents with  . Nasal Congestion    Worse in the morning.  . Cough    Productive at times.  . Allergic Rhinitis     Eyes itch and burn    Tanner Blevins has a history of the following: Patient Active Problem List   Diagnosis Date Noted  . Urinary retention with incomplete bladder emptying 04/18/2013  . Ileus, postoperative (HCC) 04/18/2013  . Appendicitis, acute 04/15/2013    History obtained from: chart review and patient.  Tanner Blevins was referred by Tanner Lance, MD.     Tanner Blevins is a 46 y.o. male presenting for an allergy and asthma evaluation. He was previously followed 15 years ago by Dr. Lucie Leather. He was only positive to grass at that time, according to the patient, and never followed up.   Asthma/Respiratory Symptom History: He does not have a formal diagnosis of asthma. He has never had an inhaler. He denies SOB. He did have an inhaler when he was diagnosed with bronchitis. He has never needed prednisone for his breathing problems. He  has never been hospitalized for breathing problems.   Allergic Rhinitis Symptom History: He was allergy tested but never on shots. He was on Flonase but it was not working completely. He does take Claritin but it has stopped working. He started Zyrtec which does provide some relief. Symptoms occur throughout the year. He has changed schools since the last testing and they got the dogs six years ago. He does endorse some itchy eyes which have worsened without the antihistamines. He also endorses a cough from the postnasal drip. He does have sinus infections fairly frequently but lately he has been going without doctor visits since antibiotics have been hard to come by. He treats at home with Mucinex and OTC treatments. Symptoms mostly occur around 3-4 times per year. He does endorse some sinus pressure in the temples and forehead today.   Tanner Blevins does have a history of deviated septum. He has surgery at [facility] Tanner Blevins). He has not been back to see Tanner Blevins since that time. He denies having a history of food allergies. He tolerates all of the major food allergens without a problem. He was diagnosed with anorexia around ten years ago. He did have some problems in college as well. 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   Otherwise, there is no history of other atopic diseases, including  drug allergies, food allergies, stinging insect allergies, or urticaria. There is no significant infectious history. Vaccinations are up to date.     Past Medical History: Patient Active Problem List  Diagnosis Date Noted  . Urinary retention with incomplete bladder emptying 04/18/2013  . Ileus, postoperative (HCC) 04/18/2013  . Appendicitis, acute 04/15/2013    Medication List:  Allergies as of 09/08/2016      Reactions   Carbamazepine Other (See Comments)   anemia      Medication List       Accurate as of 09/08/16 12:07 PM. Always use your most recent med list.          acetaminophen  325 MG tablet Commonly known as:  TYLENOL Do not take more than 4000 mg of tylenol (acetaminophen) in any 24 hours period.   Azelastine-Fluticasone 137-50 MCG/ACT Susp Place 1-2 sprays into the nose 2 (two) times daily.   diazepam 5 MG tablet Commonly known as:  VALIUM Take 5 mg by mouth every morning.   fluticasone 50 MCG/ACT nasal spray Commonly known as:  FLONASE   ibuprofen 200 MG tablet Commonly known as:  ADVIL,MOTRIN You can take 2-3 tablets every 6 hours as needed for pain.   lisinopril 10 MG tablet Commonly known as:  PRINIVIL,ZESTRIL Take 10 mg by mouth daily.   montelukast 10 MG tablet Commonly known as:  SINGULAIR Take 1 tablet (10 mg total) by mouth at bedtime.   VIVOTIF BERNA VACCINE DR capsule Generic drug:  typhoid   ZYRTEC ALLERGY 10 MG tablet Generic drug:  cetirizine Take 10 mg by mouth daily.       Birth History: non-contributory.   Developmental History: non-contributory.   Past Surgical History: Past Surgical History:  Procedure Laterality Date  . LAPAROSCOPIC APPENDECTOMY N/A 04/15/2013   Procedure: APPENDECTOMY LAPAROSCOPIC;  Surgeon: Velora Hecklerodd M Gerkin, MD;  Location: [facility];  Service: General;  Laterality: N/A;  . NASAL SEPTUM SURGERY    . SINOSCOPY       Family History: Family History  Problem Relation Age of Onset  . Other Father   . Transient ischemic attack Father   . Heart disease Father   . Allergic rhinitis Father   . Angioedema Neg Hx   . Asthma Neg Hx   . Eczema Neg Hx   . Immunodeficiency Neg Hx   . Urticaria Neg Hx      Social History: Tanner Blevins lives at home with his wife and daughter Tanner Blevins(15yo). He lives in a house that is 45 years old. There is no mildew or throat problems. He has hardwood in the main living area is carpeting in the bedrooms. There is gas heating and central cooling. There are 2 dogs inside the home. There is no tobacco exposure. He does not have dust mite coverings on his settings. 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   Review of Systems: a 14-point review of systems is pertinent for what is mentioned in HPI.  Otherwise, all other systems were negative. Constitutional: negative other than that listed in the HPI Eyes: negative other than that listed in the HPI Ears, nose, mouth, throat, and face: negative other than that listed in the HPI Respiratory: negative other than that listed in the HPI Cardiovascular: negative other than that listed in the HPI Gastrointestinal: negative other than that listed in the HPI Genitourinary: negative other than that listed in the HPI Integument: negative other than that listed in the HPI Hematologic: negative other than that listed in the HPI Musculoskeletal: negative other than that listed in the HPI Neurological: negative other than that listed in the HPI Allergy/Immunologic: negative other than that listed in  the HPI    Objective:   Blood pressure 112/80, pulse 74, temperature 98.1 F (36.7 C), temperature source Oral, resp. rate 16, height 5' 9.5" (1.765 m), weight 236 lb 9.6 oz (107.3 kg), SpO2 96 %. Body mass index is 34.44 kg/m.   Physical Exam:  General: Alert, interactive, in no acute distress. Pleasant and cooperative with the exam. Eyes: No conjunctival injection present on the right, No conjunctival injection present on the left, PERRL bilaterally, No discharge on the right, No discharge on the left and No Horner-Trantas dots present Ears: Right TM pearly gray with normal light reflex, Left TM pearly gray with normal light reflex, Right TM intact without perforation and Left TM intact without perforation.  Nose/Throat: External nose within normal limits, nasal crease present and septum midline, turbinates markedly edematous and pale with thick discharge, post-pharynx erythematous with cobblestoning in the posterior oropharynx. Tonsils 2+ without exudates Neck: Supple without thyromegaly. Adenopathy: no enlarged lymph  nodes appreciated in the anterior cervical, occipital, axillary, epitrochlear, inguinal, or popliteal regions Lungs: Clear to auscultation without wheezing, rhonchi or rales. No increased work of breathing. CV: Normal S1/S2, no murmurs. Capillary refill <2 seconds.  Abdomen: Nondistended, nontender. No guarding or rebound tenderness. Bowel sounds present in all fields and hypoactive  Skin: Warm and dry, without lesions or rashes. Multiple tattoos.  Extremities:  No clubbing, cyanosis or edema.  Neuro:   Grossly intact. No focal deficits appreciated. Responsive to questions.   Diagnostic studies:  Spirometry: results normal (FEV1: 4.00/99%, FVC: 4.95/100%, FEV1/FVC: 80%).    Spirometry consistent with normal pattern.  Allergy Studies:   Indoor/Outdoor Percutaneous Adult Environmental Panel: positive to American beech and pecan pollen. Otherwise negative with adequate controls.  Indoor/Outdoor Selected Intradermal Environmental Panel: positive to Grass mix, ragweed mix, weed mix, mold mix #2, mold mix #4, cat, dog and mite mix. Otherwise negative with adequate controls.     Malachi Bonds, MD FAAAAI [facility]

## 2016-09-08 NOTE — Patient Instructions (Addendum)
1. Chronic non-seasonal allergic rhinitis - Testing today showed: positives to beech pollen, pecan pollen, grass mix, ragweed mix, weed mix, molds, cat, dog, and dust mite - Avoidance measures provided.  - Start Dymista two sprays per nostril 1-2 times daily. - Continue with Zyrtec (cetirizine) 10mg  daily. - Start Singulair 10mg  daily. - Information on allergy shots provided. - Please call your insurance company to confirm coverage and then call us back to let us know if you are interested in starting them.  2. Return in about 3 months (around 12/06/2016).  Please inform us of any Emergency Department visits, hospitalizations, or changes in symptoms. Call us before going to the ED for breathing or allergy symptoms since we might be able to fit you in for a sick visit. Feel free to contact us anytime with any questions, problems, or concerns.  It was a pleasure to meet you today! Best wishes in the [place] Year!   Websites that have reliable patient information: 1. American Academy of Asthma, Allergy, and Immunology: www.aaaai.org 2. Food Allergy Research and Education (FARE): foodallergy.org 3. Mothers of Asthmatics: http://www.asthmacommunitynetwork.org 4. American College of Allergy, Asthma, and Immunology: www.acaai.org  Reducing Pollen Exposure  The American Academy of Allergy, Asthma and Immunology suggests the following steps to reduce your exposure to pollen during allergy seasons.    1. Do not hang sheets or clothing out to dry; pollen may collect on these items. 2. Do not mow lawns or spend time around freshly cut grass; mowing stirs up pollen. 3. Keep windows closed at night.  Keep car windows closed while driving. 4. Minimize morning activities outdoors, a time when pollen counts are usually at their highest. 5. Stay indoors as much as possible when pollen counts or humidity is high and on windy days when pollen tends to remain in the air longer. 6. Use air conditioning when  possible.  Many air conditioners have filters that trap the pollen spores. 7. Use a HEPA room air filter to remove pollen form the indoor air you breathe.  Control of Mold Allergen  Mold and fungi can grow on a variety of surfaces provided certain temperature and moisture conditions exist.  Outdoor molds grow on plants, decaying vegetation and soil.  The major outdoor mold, Alternaria and Cladosporium, are found in very high numbers during hot and dry conditions.  Generally, a late Summer - Fall peak is seen for common outdoor fungal spores.  Rain will temporarily lower outdoor mold spore count, but counts rise rapidly when the rainy period ends.  The most important indoor molds are Aspergillus and Penicillium.  Dark, humid and poorly ventilated basements are ideal sites for mold growth.  The next most common sites of mold growth are the bathroom and the kitchen.  Outdoor MicrosoftMold Control 1. Use air conditioning and keep windows closed 2. Avoid exposure to decaying vegetation. 3. Avoid leaf raking. 4. Avoid grain handling. 5. Consider wearing a face mask if working in moldy areas.  Indoor Mold Control 1. Maintain humidity below 50%. 2. Clean washable surfaces with 5% bleach solution. 3. Remove sources e.g. contaminated carpets.   Control of Dog or Cat Allergen  Avoidance is the best way to manage a dog or cat allergy. If you have a dog or cat and are allergic to dog or cats, consider removing the dog or cat from the home. If you have a dog or cat but don't want to find it a new home, or if your family wants a pet even  though someone in the household is allergic, here are some strategies that may help keep symptoms at bay:  1. Keep the pet out of your bedroom and restrict it to only a few rooms. Be advised that keeping the dog or cat in only one room will not limit the allergens to that room. 2. Don't pet, hug or kiss the dog or cat; if you do, wash your hands with soap and  water. 3. High-efficiency particulate air (HEPA) cleaners run continuously in a bedroom or living room can reduce allergen levels over time. 4. Regular use of a high-efficiency vacuum cleaner or a central vacuum can reduce allergen levels. 5. Giving your dog or cat a bath at least once a week can reduce airborne allergen.  Control of House Dust Mite Allergen    House dust mites play a major role in allergic asthma and rhinitis.  They occur in environments with high humidity wherever human skin, the food for dust mites is found. High levels have been detected in dust obtained from mattresses, pillows, carpets, upholstered furniture, bed covers, clothes and soft toys.  The principal allergen of the house dust mite is found in its feces.  A gram of dust may contain 1,000 mites and 250,000 fecal particles.  Mite antigen is easily measured in the air during house cleaning activities.    1. Encase mattresses, including the box spring, and pillow, in an air tight cover.  Seal the zipper end of the encased mattresses with wide adhesive tape. 2. Wash the bedding in water of 130 degrees Farenheit weekly.  Avoid cotton comforters/quilts and flannel bedding: the most ideal bed covering is the dacron comforter. 3. Remove all upholstered furniture from the bedroom. 4. Remove carpets, carpet padding, rugs, and non-washable window drapes from the bedroom.  Wash drapes weekly or use plastic window coverings. 5. Remove all non-washable stuffed toys from the bedroom.  Wash stuffed toys weekly. 6. Have the room cleaned frequently with a vacuum cleaner and a damp dust-mop.  The patient should not be in a room which is being cleaned and should wait 1 hour after cleaning before going into the room. 7. Close and seal all heating outlets in the bedroom.  Otherwise, the room will become filled with dust-laden air.  An electric heater can be used to heat the room. 8. Reduce indoor humidity to less than 50%.  Do not use a  humidifier.

## 2017-02-25 ENCOUNTER — Encounter (HOSPITAL_COMMUNITY): Payer: Self-pay | Admitting: *Deleted

## 2017-02-25 ENCOUNTER — Emergency Department (HOSPITAL_COMMUNITY): Payer: BC Managed Care – PPO

## 2017-02-25 ENCOUNTER — Emergency Department (HOSPITAL_COMMUNITY)
Admission: EM | Admit: 2017-02-25 | Discharge: 2017-02-25 | Disposition: A | Discharge status: Home / Self Care | Payer: BC Managed Care – PPO | Attending: Physician Assistant | Admitting: Physician Assistant

## 2017-02-25 DIAGNOSIS — I4891 Unspecified atrial fibrillation: Secondary | ICD-10-CM | POA: Insufficient documentation

## 2017-02-25 DIAGNOSIS — Z79899 Other long term (current) drug therapy: Secondary | ICD-10-CM | POA: Insufficient documentation

## 2017-02-25 DIAGNOSIS — Z87891 Personal history of nicotine dependence: Secondary | ICD-10-CM | POA: Insufficient documentation

## 2017-02-25 LAB — BASIC METABOLIC PANEL
Anion gap: 8 (ref 5–15)
BUN: 9 mg/dL (ref 6–20)
CALCIUM: 9.3 mg/dL (ref 8.9–10.3)
CO2: 28 mmol/L (ref 22–32)
Chloride: 103 mmol/L (ref 101–111)
Creatinine, Ser: 1.01 mg/dL (ref 0.61–1.24)
Glucose, Bld: 95 mg/dL (ref 65–99)
Potassium: 4.3 mmol/L (ref 3.5–5.1)
Sodium: 139 mmol/L (ref 135–145)

## 2017-02-25 LAB — CBC
HCT: 44.3 % (ref 39.0–52.0)
HEMOGLOBIN: 15.1 g/dL (ref 13.0–17.0)
MCH: 29.7 pg (ref 26.0–34.0)
MCHC: 34.1 g/dL (ref 30.0–36.0)
MCV: 87 fL (ref 78.0–100.0)
Platelets: 213 10*3/uL (ref 150–400)
RBC: 5.09 MIL/uL (ref 4.22–5.81)
RDW: 12.6 % (ref 11.5–15.5)
WBC: 7.3 10*3/uL (ref 4.0–10.5)

## 2017-02-25 LAB — I-STAT TROPONIN, ED: Troponin i, poc: 0.01 ng/mL (ref 0.00–0.08)

## 2017-02-25 MED ORDER — RIVAROXABAN 20 MG PO TABS
20.0000 mg | ORAL_TABLET | Freq: Once | ORAL | Status: AC
  Administered 2017-02-25: 20 mg via ORAL
  Filled 2017-02-25: qty 1

## 2017-02-25 MED ORDER — DILTIAZEM HCL 30 MG PO TABS: 30.0000 mg | ORAL_TABLET | Freq: Every day | ORAL | 0 refills | Status: DC | PRN

## 2017-02-25 MED ORDER — RIVAROXABAN 20 MG PO TABS: 20.0000 mg | ORAL_TABLET | Freq: Every day | ORAL | 0 refills | Status: DC

## 2017-02-25 NOTE — Discharge Instructions (Addendum)
° °  You need to follow up in 48 hours with A. fib clinic. In addition please take your Xarelto every day. Take  diltiazem only if your heart rate goes up like you felt today.

## 2017-02-25 NOTE — ED Triage Notes (Signed)
Pt reports multiple episodes of dizziness last week. Today began having palpitations and went to [facility]. Sent here due to atrial fib, no hx of same. Denies any cp at this time. Reports palpitations and feeling anxious.

## 2017-02-25 NOTE — ED Provider Notes (Signed)
[facility]-EMERGENCY DEPT Provider Note   CSN: [id number] Arrival date & time: 02/25/17  1738     History   Chief Complaint Chief Complaint  Patient presents with  . Atrial Fibrillation  . Palpitations    HPI Tanner Blevins is a 46 y.o. male.  HPI  Patient is a 46 year old male presenting with a fluttering in his chest. Patient reports that at 4 PM today he felt his heart rate go into different rhythm. He checked it on his blood pressure cuff and found himself to be in a "irregular rhythm". Patient went to urgent care had an EKG showing A. fib and was sent here. Patient not on any anticoagulation he does not have history of A. fib. Patient has a history of hypertension , no cholesterol or diabetes.  Past Medical History:  Diagnosis Date  . Hx of anorexia nervosa   . Ileus, postoperative (HCC) 04/18/2013  . Urinary retention with incomplete bladder emptying 04/18/2013   Post op    Patient Active Problem List   Diagnosis Date Noted  . Urinary retention with incomplete bladder emptying 04/18/2013  . Ileus, postoperative (HCC) 04/18/2013  . Appendicitis, acute 04/15/2013    Past Surgical History:  Procedure Laterality Date  . LAPAROSCOPIC APPENDECTOMY N/A 04/15/2013   Procedure: APPENDECTOMY LAPAROSCOPIC;  Surgeon: Velora Heckler, MD;  Location: [facility];  Service: General;  Laterality: N/A;  . NASAL SEPTUM SURGERY    . SINOSCOPY         Home Medications    Prior to Admission medications   Medication Sig Start Date End Date Taking? Authorizing Provider  acetaminophen (TYLENOL) 325 MG tablet Do not take more than 4000 mg of tylenol (acetaminophen) in any 24 hours period. Patient taking differently: Take 325 mg by mouth every 4 (four) hours as needed. Do not take more than 4000 mg of tylenol (acetaminophen) in any 24 hours period. 04/18/13  Yes Sherrie George, PA-C  cetirizine (ZYRTEC ALLERGY) 10 MG tablet Take 10 mg by mouth daily.   Yes [provider]    lisinopril (PRINIVIL,ZESTRIL) 10 MG tablet Take 10 mg by mouth daily.   Yes [provider]  montelukast (SINGULAIR) 10 MG tablet Take 1 tablet (10 mg total) by mouth at bedtime. 09/08/16  Yes Alfonse Spruce, MD  Azelastine-Fluticasone [phone] MCG/ACT SUSP Place 1-2 sprays into the nose 2 (two) times daily. Patient not taking: Reported on 02/25/2017 09/08/16   Alfonse Spruce, MD  diltiazem (CARDIZEM) 30 MG tablet Take 1 tablet (30 mg total) by mouth daily as needed (afib). 02/25/17   Lynzie Cliburn Lyn, MD  ibuprofen (ADVIL,MOTRIN) 200 MG tablet You can take 2-3 tablets every 6 hours as needed for pain. Patient not taking: Reported on 09/08/2016 04/18/13   Sherrie George, PA-C  rivaroxaban (XARELTO) 20 MG TABS tablet Take 1 tablet (20 mg total) by mouth daily with supper. 02/25/17   Roneisha Stern, Cindee Salt, MD    Family History Family History  Problem Relation Age of Onset  . Other Father   . Transient ischemic attack Father   . Heart disease Father   . Allergic rhinitis Father   . Angioedema Neg Hx   . Asthma Neg Hx   . Eczema Neg Hx   . Immunodeficiency Neg Hx   . Urticaria Neg Hx     Social History Social History  Substance Use Topics  . Smoking status: Former Smoker    Packs/day: 0.25    Types: Cigarettes    Quit date:  08/08/2012  . Smokeless tobacco: Never Used  . Alcohol use 0.6 oz/week    1 Cans of beer per week     Allergies   Carbamazepine   Review of Systems Review of Systems  Constitutional: Negative for activity change.  Respiratory: Negative for chest tightness and shortness of breath.   Cardiovascular: Positive for palpitations. Negative for chest pain.  Gastrointestinal: Negative for abdominal pain.  All other systems reviewed and are negative.    Physical Exam Updated Vital Signs BP 114/80   Pulse 88   Temp 98.4 F (36.9 C) (Oral)   Resp 20   Ht 5\' 11"  (1.803 m)   Wt 108.9 kg (240 lb)   SpO2 94%   BMI 33.47 kg/m    Physical Exam  Constitutional: He is oriented to person, place, and time. He appears well-nourished.  HENT:  Head: Normocephalic.  Eyes: Conjunctivae are normal.  Cardiovascular:  Irregular irregular.  Pulmonary/Chest: Effort normal and breath sounds normal. No respiratory distress.  Abdominal: Soft. He exhibits no distension. There is no tenderness.  Neurological: He is oriented to person, place, and time.  Skin: Skin is warm and dry. He is not diaphoretic.  Psychiatric: He has a normal mood and affect. His behavior is normal.     ED Treatments / Results  Labs (all labs ordered are listed, but only abnormal results are displayed) Labs Reviewed  BASIC METABOLIC PANEL  CBC  I-STAT TROPONIN, ED    EKG  EKG Interpretation  Date/Time:  Wednesday February 25 2017 18:28:25 EDT Ventricular Rate:  154 PR Interval:    QRS Duration: 76 QT Interval:  284 QTC Calculation: 454 R Axis:   26 Text Interpretation:  Atrial fibrillation with rapid ventricular response Abnormal ECG Atrial fibrillation Confirmed by Corlis LeakMackuen, Opel Lejeune ([id number]) on 02/25/2017 7:21:12 PM       Radiology Dg Chest 2 View  Result Date: 02/25/2017 CLINICAL DATA:  Atrial fibrillation with chest palpitations EXAM: CHEST  2 VIEW COMPARISON:  Chest radiograph 11/10/2005 FINDINGS: The heart size and mediastinal contours are within normal limits. Both lungs are clear. The visualized skeletal structures are unremarkable. IMPRESSION: No active cardiopulmonary disease. Electronically Signed   By: Deatra RobinsonKevin  Herman M.D.   On: 02/25/2017 20:11    Procedures Procedures (including critical care time)  Medications Ordered in ED Medications  rivaroxaban (XARELTO) tablet 20 mg (20 mg Oral Given 02/25/17 2239)     Initial Impression / Assessment and Plan / ED Course  I have reviewed the triage vital signs and the nursing notes.  Pertinent labs & imaging results that were available during my care of the patient were reviewed by me  and considered in my medical decision making (see chart for details).      46 year old male who felt himself going into A. fib 3 hours prior to arrival. Patient A. fib with RVR. Given the fact that he knew the onset, would be a good candidate for cardioversion  11:46 PM Unfortunately patient had to wait for a period time because of several traumas. By the time he was reassessed and he was back in a sinus rhythm. Discussed again with cardiology and plan to give him immediate release dilt for home. In addition to starting xarelto. Pharmacy consulted patient about xarelto. Will have patient follow up in 48 hours in A. fib clinic as cardiology suggests.    Final Clinical Impressions(s) / ED Diagnoses   Final diagnoses:  Atrial fibrillation with RVR [facility](HCC)    New Prescriptions Discharge  Medication List as of 02/25/2017 11:11 PM    START taking these medications   Details  diltiazem (CARDIZEM) 30 MG tablet Take 1 tablet (30 mg total) by mouth daily as needed (afib)., Starting Wed 02/25/2017, Print    rivaroxaban (XARELTO) 20 MG TABS tablet Take 1 tablet (20 mg total) by mouth daily with supper., Starting Wed 02/25/2017, Print         Raider Valbuena, Cindee Salt, MD 02/25/17 [phone]

## 2017-02-27 ENCOUNTER — Encounter (HOSPITAL_COMMUNITY): Payer: Self-pay | Admitting: Nurse Practitioner

## 2017-02-27 ENCOUNTER — Ambulatory Visit (HOSPITAL_COMMUNITY)
Admission: RE | Admit: 2017-02-27 | Discharge: 2017-02-27 | Disposition: A | Discharge status: Home / Self Care | Payer: BC Managed Care – PPO | Source: Ambulatory Visit | Attending: Nurse Practitioner | Admitting: Nurse Practitioner

## 2017-02-27 VITALS — BP 102/68 | HR 73 | Ht 71.0 in | Wt 241.2 lb

## 2017-02-27 DIAGNOSIS — Z7901 Long term (current) use of anticoagulants: Secondary | ICD-10-CM | POA: Diagnosis not present

## 2017-02-27 DIAGNOSIS — Z8249 Family history of ischemic heart disease and other diseases of the circulatory system: Secondary | ICD-10-CM | POA: Insufficient documentation

## 2017-02-27 DIAGNOSIS — Z87891 Personal history of nicotine dependence: Secondary | ICD-10-CM | POA: Insufficient documentation

## 2017-02-27 DIAGNOSIS — Z888 Allergy status to other drugs, medicaments and biological substances status: Secondary | ICD-10-CM | POA: Diagnosis not present

## 2017-02-27 DIAGNOSIS — I10 Essential (primary) hypertension: Secondary | ICD-10-CM | POA: Insufficient documentation

## 2017-02-27 DIAGNOSIS — F5000 Anorexia nervosa, unspecified: Secondary | ICD-10-CM | POA: Insufficient documentation

## 2017-02-27 DIAGNOSIS — I4891 Unspecified atrial fibrillation: Secondary | ICD-10-CM | POA: Diagnosis present

## 2017-02-27 DIAGNOSIS — Z9889 Other specified postprocedural states: Secondary | ICD-10-CM | POA: Diagnosis not present

## 2017-02-27 DIAGNOSIS — I480 Paroxysmal atrial fibrillation: Secondary | ICD-10-CM

## 2017-03-02 NOTE — Progress Notes (Signed)
Primary Care Physician: Blair Heys, MD Referring Physician:[facility]   Tanner Blevins is a 46 y.o. male with a h/o anorexia, HTN that is in the afib clinc for further eval of ER visit 8/8 for sensation of fast heart beat. He was found to be in new onset afib when presenting to urgent care and then was sent to [facility] ER, thought to have been in  afib x 3 hours at that point. He was going to be cardioverted but did spontaneously convert. He was given prn cardizem 30 mg and started on xarelto for a chadsvasc scvore of one for HTN.   He denies significant alcohol use, moderate caffeine use. Wife states he does not snore. He does not use tobacco. He does work as a Mudlogger.H/o of anorexia/extreme exercising 4-6 years ago.  Today, he denies symptoms of palpitations, chest pain, shortness of breath, orthopnea, PND, lower extremity edema, dizziness, presyncope, syncope, or neurologic sequela. The patient is tolerating medications without difficulties and is otherwise without complaint today.   Past Medical History:  Diagnosis Date  . Hx of anorexia nervosa   . Ileus, postoperative (HCC) 04/18/2013  . Urinary retention with incomplete bladder emptying 04/18/2013   Post op   Past Surgical History:  Procedure Laterality Date  . LAPAROSCOPIC APPENDECTOMY N/A 04/15/2013   Procedure: APPENDECTOMY LAPAROSCOPIC;  Surgeon: Velora Heckler, MD;  Location: [facility];  Service: General;  Laterality: N/A;  . NASAL SEPTUM SURGERY    . SINOSCOPY      Current Outpatient Prescriptions  Medication Sig Dispense Refill  . acetaminophen (TYLENOL) 325 MG tablet Do not take more than 4000 mg of tylenol (acetaminophen) in any 24 hours period. (Patient taking differently: Take 325 mg by mouth every 4 (four) hours as needed. Do not take more than 4000 mg of tylenol (acetaminophen) in any 24 hours period.) 10 tablet 0  . cetirizine (ZYRTEC ALLERGY) 10 MG tablet Take 10 mg by mouth daily.    Marland Kitchen lisinopril  (PRINIVIL,ZESTRIL) 10 MG tablet Take 10 mg by mouth daily.    . montelukast (SINGULAIR) 10 MG tablet Take 1 tablet (10 mg total) by mouth at bedtime. 30 tablet 5  . rivaroxaban (XARELTO) 20 MG TABS tablet Take 1 tablet (20 mg total) by mouth daily with supper. 30 tablet 0  . Azelastine-Fluticasone 137-50 MCG/ACT SUSP Place 1-2 sprays into the nose 2 (two) times daily. (Patient not taking: Reported on 02/25/2017) 23 g 5  . diltiazem (CARDIZEM) 30 MG tablet Take 1 tablet (30 mg total) by mouth daily as needed (afib). (Patient not taking: Reported on 02/27/2017) 10 tablet 0   No current facility-administered medications for this encounter.     Allergies  Allergen Reactions  . Carbamazepine Other (See Comments)    anemia    Social History   Social History  . Marital status: Married    Spouse name: N/A  . Number of children: N/A  . Years of education: N/A   Occupational History  . Not on file.   Social History Main Topics  . Smoking status: Former Smoker    Packs/day: 0.25    Types: Cigarettes    Quit date: 08/08/2012  . Smokeless tobacco: Never Used  . Alcohol use 0.6 oz/week    1 Cans of beer per week  . Drug use: Unknown  . Sexual activity: Yes   Other Topics Concern  . Not on file   Social History Narrative  . No narrative on file  Family History  Problem Relation Age of Onset  . Other Father   . Transient ischemic attack Father   . Heart disease Father   . Allergic rhinitis Father   . Angioedema Neg Hx   . Asthma Neg Hx   . Eczema Neg Hx   . Immunodeficiency Neg Hx   . Urticaria Neg Hx     ROS- All systems are reviewed and negative except as per the HPI above  Physical Exam: Vitals:   02/27/17 0937  BP: 102/68  Pulse: 73  Weight: 241 lb 3.2 oz (109.4 kg)  Height: 5\' 11"  (1.803 m)   Wt Readings from Last 3 Encounters:  02/27/17 241 lb 3.2 oz (109.4 kg)  02/25/17 240 lb (108.9 kg)  09/08/16 236 lb 9.6 oz (107.3 kg)    Labs: Lab Results  Component  Value Date   NA 139 02/25/2017   K 4.3 02/25/2017   CL 103 02/25/2017   CO2 28 02/25/2017   GLUCOSE 95 02/25/2017   BUN 9 02/25/2017   CREATININE 1.01 02/25/2017   CALCIUM 9.3 02/25/2017   No results found for: INR No results found for: CHOL, HDL, LDLCALC, TRIG   GEN- The patient is well appearing, alert and oriented x 3 today.   Head- normocephalic, atraumatic Eyes-  Sclera clear, conjunctiva pink Ears- hearing intact Oropharynx- clear Neck- supple, no JVP Lymph- no cervical lymphadenopathy Lungs- Clear to ausculation bilaterally, normal work of breathing Heart- Regular rate and rhythm, no murmurs, rubs or gallops, PMI not laterally displaced GI- soft, NT, ND, + BS Extremities- no clubbing, cyanosis, or edema MS- no significant deformity or atrophy Skin- no rash or lesion Psych- euthymic mood, full affect Neuro- strength and sensation are intact  EKG-NSR at 73 bpm, Pr int 178 ms, qrs int 84 ms, qtc 412 ms Epic records reviewed    Assessment and Plan: 1. New onset afib Spontaneously converted in the ER  No further reoccurrence General education re afib Try to diminish caffeine intake  Can finish xarelto and then stop for a chadsvasc score of 1 Precautions with anticoagulation discussed Discussed with pt how to treat breakthrough afib with 30 mg cardizem Echo  Will call results of echo and refer to general cardiology  Lupita Leashonna C. Matthew Folksarroll, ANP-C Afib Clinic [facility] [address] [place], [place] [address] [phone]

## 2017-03-06 ENCOUNTER — Ambulatory Visit (HOSPITAL_COMMUNITY)
Admission: RE | Admit: 2017-03-06 | Discharge: 2017-03-06 | Disposition: A | Discharge status: Home / Self Care | Payer: BC Managed Care – PPO | Source: Ambulatory Visit | Attending: Nurse Practitioner | Admitting: Nurse Practitioner

## 2017-03-06 DIAGNOSIS — I480 Paroxysmal atrial fibrillation: Secondary | ICD-10-CM | POA: Diagnosis present

## 2017-03-06 NOTE — Progress Notes (Signed)
  Echocardiogram 2D Echocardiogram has been performed.  Tanner Blevins 03/06/2017, 8:57 AM

## 2017-03-07 ENCOUNTER — Other Ambulatory Visit: Payer: Self-pay | Admitting: Allergy & Immunology

## 2017-03-09 ENCOUNTER — Other Ambulatory Visit (HOSPITAL_COMMUNITY): Payer: Self-pay | Admitting: *Deleted

## 2017-03-09 DIAGNOSIS — I480 Paroxysmal atrial fibrillation: Secondary | ICD-10-CM

## 2017-04-08 ENCOUNTER — Telehealth (HOSPITAL_COMMUNITY): Payer: Self-pay | Admitting: Nurse Practitioner

## 2017-04-08 NOTE — Telephone Encounter (Signed)
Pt stated this the first episode since the ER with afib -- was at a convention for PE teachers and was exercising at time of onset. Took a diltiazem  tablet and felt better but does feel his rhythm is still irregular. Instructed pt if HR is >100 can take another cardizem in 4 hours if needed. If continues in afib inform office for appointment. Pt had recently finished xarelto for 30 days for chadsvasc score of 1. Pt will let us know if continued afib.

## 2017-04-08 NOTE — Telephone Encounter (Signed)
Pt calling regarding going into afib this am at 930a, took an extra Diltiazem at 10a. Would like to know how many extra pills he could/should take-pls advise [phone]

## 2017-04-10 ENCOUNTER — Institutional Professional Consult (permissible substitution): Payer: BC Managed Care – PPO | Admitting: Internal Medicine

## 2017-05-06 ENCOUNTER — Other Ambulatory Visit: Payer: Self-pay | Admitting: Allergy & Immunology

## 2017-05-06 NOTE — Telephone Encounter (Signed)
Received fax for 90 day supply of montelukast. Patient has not been seen since 09/08/2016. Was to return in 3 months. Refill was sent in august patient needs office visit. Request was denied.

## 2017-05-08 ENCOUNTER — Other Ambulatory Visit: Payer: Self-pay | Admitting: Allergy & Immunology

## 2017-05-18 ENCOUNTER — Other Ambulatory Visit: Payer: Self-pay | Admitting: *Deleted

## 2017-05-18 NOTE — Telephone Encounter (Signed)
rx refill received for Montelukast 10mg  [facility] [phone]. Patient needs office visit.

## 2017-05-29 ENCOUNTER — Encounter: Payer: Self-pay | Admitting: Internal Medicine

## 2017-05-29 ENCOUNTER — Ambulatory Visit: Payer: BC Managed Care – PPO | Admitting: Internal Medicine

## 2017-05-29 ENCOUNTER — Encounter (INDEPENDENT_AMBULATORY_CARE_PROVIDER_SITE_OTHER): Payer: Self-pay

## 2017-05-29 VITALS — BP 114/72 | HR 75 | Ht 71.0 in | Wt 243.0 lb

## 2017-05-29 DIAGNOSIS — I480 Paroxysmal atrial fibrillation: Secondary | ICD-10-CM

## 2017-05-29 DIAGNOSIS — R0789 Other chest pain: Secondary | ICD-10-CM

## 2017-05-29 DIAGNOSIS — I10 Essential (primary) hypertension: Secondary | ICD-10-CM | POA: Diagnosis not present

## 2017-05-29 NOTE — Progress Notes (Signed)
New Outpatient Visit Date: 05/29/2017  Referring Provider: Rudi Cocoonna Mackintosh, NP [facility]  Chief Complaint: Atrial fibrillation  HPI:  [name] is a 46 y.o. male who is being seen today for the evaluation of paroxysmal atrial fibrillation at the request of Rudi Cocoonna Fairfax, NP. He has a history of paroxysmal atrial fibrillation and hypertension.  In mid August, he first noticed palpitations with a fast and irregular heartbeat while sitting at home.  The episode lasted for a few hours and ultimately prompted him to come to the emergency department for further evaluation.  Arrangements were made for cardioversion in the ED, though he spontaneously noted to normal sinus rhythm before cardioversion was performed.  He was discharged on rivaroxaban and followed up 2 days later in the atrial fibrillation clinic.  He was found to be in sinus rhythm at that time.  He was advised to continue with rivaroxaban; he completed a one-month course of this.  Since his initial presentation, [name] reports a single additional episode of palpitations.  This happened about a week after his visit with the atrial fibrillation clinic.  He was at a conference and noted racing of the heart again.  He took 1 dose of as needed diltiazem without relief.  He took a second dose about 5 hours later and subsequently had resolution of his symptoms.  He has not had any further episodes of palpitations.  He notes some mild shortness of breath with atrial fibrillation but otherwise is asymptomatic.  At other times, he denies shortness of breath, lightheadedness, orthopnea, PND, and edema.  Over the last couple months he has occasionally noted vague discomfort in his chest when sitting still.  He wonders if this is due to acid reflux or indigestion.  He denies exertional chest pain/pressure as well as exertional shortness of breath.  [name] has a history of obstructive sleep apnea and was previously  prescribed CPAP.  However, he is currently not using CPAP but instead has a in oral device prescribed by his dentist for management of sleep apnea.  He typically consumes 1 cup of coffee a day as well as several caffeinated sodas.  He denies not smoke, drink alcohol, or use illicit drugs.  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  He is quite active at work but does not exercise otherwise, largely due to fatigue at the Laretta Pyatt of the day.  He is also concerned about starting an aggressive exercise regimen.  He was previously anorexic and exercised excessively.  He does not want to fall into this pattern again.  --------------------------------------------------------------------------------------------------  Cardiovascular History & Procedures: Cardiovascular Problems:  Excisional atrial fibrillation  Risk Factors:  Hypertension and male gender  Cath/PCI:  None  CV Surgery:  None  EP Procedures and Devices:  None  Non-Invasive Evaluation(s):  TTE (03/06/17): Normal LV size and wall thickness.  LVEF 55-60% with normal wall motion and diastolic function.  Normal RV size and function.  No significant valvular abnormalities.  Normal left atrial size.  Recent CV Pertinent Labs: Lab Results  Component Value Date   K 4.3 02/25/2017   BUN 9 02/25/2017   CREATININE 1.01 02/25/2017    --------------------------------------------------------------------------------------------------  Past Medical History:  Diagnosis Date  . Hx of anorexia nervosa   . Ileus, postoperative (HCC) 04/18/2013  . Urinary retention with incomplete bladder emptying 04/18/2013   Post op    Past Surgical History:  Procedure Laterality Date  . NASAL SEPTUM SURGERY    .  SINOSCOPY      Current Meds  Medication Sig  . acetaminophen (TYLENOL) 325 MG tablet Do not take more than 4000 mg of tylenol (acetaminophen) in any 24 hours period. (Patient taking differently: Take 325 mg by mouth every 4  (four) hours as needed. Do not take more than 4000 mg of tylenol (acetaminophen) in any 24 hours period.)  . Azelastine-Fluticasone 137-50 MCG/ACT SUSP Place 1-2 sprays into the nose 2 (two) times daily.  . cetirizine (ZYRTEC ALLERGY) 10 MG tablet Take 10 mg by mouth daily.  Marland Kitchen diltiazem (CARDIZEM) 30 MG tablet Take 1 tablet (30 mg total) by mouth daily as needed (afib).  Marland Kitchen lisinopril (PRINIVIL,ZESTRIL) 10 MG tablet Take 10 mg by mouth daily.  . [DISCONTINUED] montelukast (SINGULAIR) 10 MG tablet TAKE 1 TABLET (10 MG TOTAL) BY MOUTH AT BEDTIME.  . [DISCONTINUED] rivaroxaban (XARELTO) 20 MG TABS tablet Take 1 tablet (20 mg total) by mouth daily with supper.    Allergies: Carbamazepine  Social History   Socioeconomic History  . Marital status: Married    Spouse name: Not on file  . Number of children: Not on file  . Years of education: Not on file  . Highest education level: Not on file  Social Needs  . Financial resource strain: Not on file  . Food insecurity - worry: Not on file  . Food insecurity - inability: Not on file  . Transportation needs - medical: Not on file  . Transportation needs - non-medical: Not on file  Occupational History  . Not on file  Tobacco Use  . Smoking status: Former Smoker    Packs/day: 0.25    Types: Cigarettes    Last attempt to quit: 08/08/2012    Years since quitting: 4.8  . Smokeless tobacco: Never Used  Substance and Sexual Activity  . Alcohol use: Yes    Alcohol/week: 0.6 oz    Types: 1 Cans of beer per week  . Drug use: Not on file  . Sexual activity: Yes  Other Topics Concern  . Not on file  Social History Narrative  . Not on file    Family History  Problem Relation Age of Onset  . Other Father   . Transient ischemic attack Father   . Heart disease Father   . Allergic rhinitis Father   . Angioedema Neg Hx   . Asthma Neg Hx   . Eczema Neg Hx   . Immunodeficiency Neg Hx   . Urticaria Neg Hx     Review of Systems: A 12-system  review of systems was performed and was negative except as noted in the HPI.  --------------------------------------------------------------------------------------------------  Physical Exam: BP 114/72   Pulse 75   Ht 5\' 11"  (1.803 m)   Wt 243 lb (110.2 kg)   BMI 33.89 kg/m   General: Obese man, seated comfortably in the exam room. HEENT: No conjunctival pallor or scleral icterus. Moist mucous membranes. OP clear. Neck: Supple without lymphadenopathy, thyromegaly, JVD, or HJR. No carotid bruit. Lungs: Normal work of breathing. Clear to auscultation bilaterally without wheezes or crackles. Heart: Regular rate and rhythm without murmurs, rubs, or gallops. Non-displaced PMI. Abd: Bowel sounds present. Soft, NT/ND without hepatosplenomegaly Ext: No lower extremity edema. Radial, PT, and DP pulses are 2+ bilaterally Skin: Warm and dry without rash. Neuro: CNIII-XII intact. Strength and fine-touch sensation intact in upper and lower extremities bilaterally. Psych: Normal mood and affect.  EKG: Sinus rhythm with low voltage.  Otherwise, no significant abnormalities.  Lab  Results  Component Value Date   WBC 7.3 02/25/2017   HGB 15.1 02/25/2017   HCT 44.3 02/25/2017   MCV 87.0 02/25/2017   PLT 213 02/25/2017    Lab Results  Component Value Date   NA 139 02/25/2017   K 4.3 02/25/2017   CL 103 02/25/2017   CO2 28 02/25/2017   BUN 9 02/25/2017   CREATININE 1.01 02/25/2017   GLUCOSE 95 02/25/2017   ALT 22 02/14/2010    No results found for: CHOL, HDL, LDLCALC, LDLDIRECT, TRIG, CHOLHDL   --------------------------------------------------------------------------------------------------  ASSESSMENT AND PLAN: Paroxysmal atrial fibrillation Two symptomatic events reported been noted by [name], beginning in August.  The most recent episode occurred in mid to late August and resolved after taking as needed diltiazem x2.  [name] was on rivaroxaban for 1 month but is  currently off all anticoagulation.  Given his history of hypertension, his CHADSVASC score is 1.  I have recommended that he begin taking aspirin 81 mg daily.  Given infrequent episodes of atrial fibrillation, I think it is reasonable to continue with as needed diltiazem.  If his episodes become more frequent or persistent, we may need to consider other strategies.  I have recommended that he speak with his sleep specialist about starting CPAP or repeating a sleep study, as suboptimally treated sleep apnea can be a risk factor for atrial fibrillation.  I have encouraged him to decrease his caffeine intake and to lose weight.  Atypical chest pain [name] notes vague chest pressure at times when watching television at night.  He does not have any exertional chest pain.  His EKG today is normal.  I have a low suspicion for obstructive coronary artery disease.  However, if his symptoms worsen, particularly if he has exertional discomfort, we will need to consider noninvasive ischemia evaluation.  Hypertension Blood pressures well controlled today.  Continue with lisinopril.  Follow-up: Return to clinic in 6 months.  Yvonne Kendallhristopher Jahnavi Muratore, MD 05/29/2017 3:58 PM

## 2017-05-29 NOTE — Patient Instructions (Signed)
Medication Instructions:  Take aspirin  81 mg daily.  Labwork: None   Testing/Procedures: None   Follow-Up: Your physician wants you to follow-up in: 6 months with Dr End. (May 2019).You will receive a reminder letter in the mail two months in advance. If you don't receive a letter, please call our office to schedule the follow-up appointment.   Any Other Special Instructions Will Be Listed Below (If Applicable).  Follow-up with your sleep apnea doctor for recommendations about your sleep apnea or if you need another sleep study.  If you need a refill on your cardiac medications before your next appointment, please call your pharmacy.

## 2017-05-30 ENCOUNTER — Encounter: Payer: Self-pay | Admitting: Internal Medicine

## 2017-05-30 DIAGNOSIS — I480 Paroxysmal atrial fibrillation: Secondary | ICD-10-CM | POA: Insufficient documentation

## 2017-05-30 DIAGNOSIS — R0789 Other chest pain: Secondary | ICD-10-CM | POA: Insufficient documentation

## 2017-05-30 DIAGNOSIS — I10 Essential (primary) hypertension: Secondary | ICD-10-CM | POA: Insufficient documentation

## 2018-01-15 ENCOUNTER — Ambulatory Visit: Payer: BC Managed Care – PPO | Admitting: Internal Medicine

## 2018-01-15 ENCOUNTER — Encounter (INDEPENDENT_AMBULATORY_CARE_PROVIDER_SITE_OTHER): Payer: Self-pay

## 2018-01-15 ENCOUNTER — Encounter: Payer: Self-pay | Admitting: Internal Medicine

## 2018-01-15 VITALS — BP 98/80 | HR 71 | Ht 71.0 in | Wt 245.5 lb

## 2018-01-15 DIAGNOSIS — I480 Paroxysmal atrial fibrillation: Secondary | ICD-10-CM | POA: Diagnosis not present

## 2018-01-15 DIAGNOSIS — R0789 Other chest pain: Secondary | ICD-10-CM

## 2018-01-15 DIAGNOSIS — R002 Palpitations: Secondary | ICD-10-CM | POA: Diagnosis not present

## 2018-01-15 MED ORDER — DILTIAZEM HCL 30 MG PO TABS: 30.0000 mg | ORAL_TABLET | Freq: Every day | ORAL | 0 refills | Status: DC | PRN

## 2018-01-15 NOTE — Patient Instructions (Addendum)
Medication Instructions:  Famotidine (Pepcid) 20 mg twice per day Ranitidine (Zantac) 150 mg twice per day  -- If you need a refill on your cardiac medications before your next appointment, please call your pharmacy. --  Labwork: None ordered  Testing/Procedures: None ordered  Follow-Up: Your physician wants you to follow-up in: 1 month with APP  Thank you for choosing [facility]!!    Any Other Special Instructions Will Be Listed Below (If Applicable).

## 2018-01-15 NOTE — Progress Notes (Signed)
Follow-up Outpatient Visit Date: 01/15/2018  Primary Care Provider: Gaynelle Arabian, MD [address] [place] [address]  Chief Complaint: Palpitations and chest pain  HPI:  [name] is a 47 y.o. year-old male with history of paroxysmal atrial fibrillation, hypertension, and obstructive sleep apnea, who presents for follow-up of atrial fibrillation.  I met him in November.  He was feeling well at the time, though he noted occasional vague chest discomfort while watching television.  I advised him to begin taking aspirin 81 mg daily and continue as needed diltiazem.  We agreed to defer evaluation of vague chest discomfort while watching television.  Today, [name] reports that he has been feeling fairly well.  He has intermittent flutters in the chest that last a few seconds at a time that happen a couple times a week.  He has not had any sustained palpitations reminiscent of his episode of atrial fibrillation last summer.  There are no clear exacerbating factors.  At times, he will notice mild transient chest tightness around the time of his flutters (can occur before, during, or after flutters).  At times, he will also have a gassy feeling in his chest and wonders if he is having heartburn.  His wife also notices more frequent throat clearing.  [name] reports stable exertional dyspnea that he attributes to "being out of shape."  He denies lightheadedness, edema, and orthopnea.  He has not used diltiazem since last year.  --------------------------------------------------------------------------------------------------  Cardiovascular History & Procedures: Cardiovascular Problems:  Paroxysmal atrial fibrillation  Risk Factors:  Hypertension and male gender  Cath/PCI:  None  CV Surgery:  None  EP Procedures and Devices:  None  Non-Invasive Evaluation(s):  TTE (03/06/17): Normal LV size and wall thickness.  LVEF 55-60% with normal wall motion  and diastolic function.  Normal RV size and function.  No significant valvular abnormalities.  Normal left atrial size.  Recent CV Pertinent Labs: Lab Results  Component Value Date   K 4.3 02/25/2017   BUN 9 02/25/2017   CREATININE 1.01 02/25/2017    Past medical and surgical history were reviewed and updated in EPIC.  Current Meds  Medication Sig  . acetaminophen (TYLENOL) 325 MG tablet Take 650 mg by mouth as directed.  Marland Kitchen aspirin EC 81 MG tablet Take 1 tablet (81 mg total) daily by mouth.  . Azelastine-Fluticasone 137-50 MCG/ACT SUSP Place 1-2 sprays into the nose 2 (two) times daily.  . cetirizine (ZYRTEC ALLERGY) 10 MG tablet Take 10 mg by mouth daily.  Marland Kitchen diltiazem (CARDIZEM) 30 MG tablet Take 1 tablet (30 mg total) by mouth daily as needed (afib).  Marland Kitchen lisinopril (PRINIVIL,ZESTRIL) 10 MG tablet Take 10 mg by mouth daily.  . Omega-3 Fatty Acids (FISH OIL PO) Take by mouth as directed.    Allergies: Carbamazepine  Social History   Tobacco Use  . Smoking status: Former Smoker    Packs/day: 0.25    Years: 15.00    Pack years: 3.75    Types: Cigarettes    Last attempt to quit: 08/08/2012    Years since quitting: 5.4  . Smokeless tobacco: Never Used  Substance Use Topics  . Alcohol use: Yes    Comment: One drink per month  . Drug use: No    Family History  Problem Relation Age of Onset  . Transient ischemic attack Father   . Heart disease Father   . Allergic rhinitis Father   . AAA (abdominal aortic aneurysm) Father   .  Healthy Mother   . Healthy Sister   . Heart attack Maternal Grandfather   . Angioedema Neg Hx   . Asthma Neg Hx   . Eczema Neg Hx   . Immunodeficiency Neg Hx   . Urticaria Neg Hx     Review of Systems: A 12-system review of systems was performed and was negative except as noted in the HPI.  --------------------------------------------------------------------------------------------------  Physical Exam: BP 98/80   Pulse 71   Ht 5' 11"   (1.803 m)   Wt 245 lb 8 oz (111.4 kg)   SpO2 97%   BMI 34.24 kg/m    General: NAD.  Accompanied by his wife. HEENT: No conjunctival pallor or scleral icterus. Moist mucous membranes.  OP clear. Neck: Supple without lymphadenopathy, thyromegaly, JVD, or HJR. Lungs: Normal work of breathing. Clear to auscultation bilaterally without wheezes or crackles. Heart: Regular rate and rhythm without murmurs, rubs, or gallops. Non-displaced PMI. Abd: Bowel sounds present. Soft, NT/ND without hepatosplenomegaly Ext: No lower extremity edema. Radial, PT, and DP pulses are 2+ bilaterally. Skin: Warm and dry without rash.  EKG: Sinus bradycardia (heart rate 57 bpm) with low voltage.  Otherwise, no significant abnormalities.  Lab Results  Component Value Date   WBC 7.3 02/25/2017   HGB 15.1 02/25/2017   HCT 44.3 02/25/2017   MCV 87.0 02/25/2017   PLT 213 02/25/2017    Lab Results  Component Value Date   NA 139 02/25/2017   K 4.3 02/25/2017   CL 103 02/25/2017   CO2 28 02/25/2017   BUN 9 02/25/2017   CREATININE 1.01 02/25/2017   GLUCOSE 95 02/25/2017   ALT 22 02/14/2010    No results found for: CHOL, HDL, LDLCALC, LDLDIRECT, TRIG, CHOLHDL  --------------------------------------------------------------------------------------------------  ASSESSMENT AND PLAN: Palpitations Brief and self-limited, not consistent with prior episode of atrial fibrillation.  We discussed ambulatory cardiac monitoring, with though Tanner Blevins would like to defer this for the time being.  I have encouraged him to minimize his caffeine and alcohol intake.  Atypical chest pain Vague and sometimes associated with palpitations.  Discomfort is not exertional.  He wonders if he may be developing GERD, given occasional gassy feeling in his chest as well as increased throat clearing noted by his wife.  I have recommended regular use of an H2 blocker (famotidine 20 mg twice daily or ranitidine 150 mg twice daily) for 1  month to see if his symptoms improve.  Otherwise, we will need to consider exercise tolerance test.  Paroxysmal atrial fibrillation No recurrence of atrial fibrillation by symptoms.  Given CHADSVASC score of 1 (HTN), we will defer anticoagulation.  Continue with as needed diltiazem.  Follow-up: Return to clinic in 1 month.  Nelva Bush, MD 01/15/2018 9:48 AM

## 2018-01-16 ENCOUNTER — Encounter: Payer: Self-pay | Admitting: Internal Medicine

## 2018-01-16 DIAGNOSIS — R002 Palpitations: Secondary | ICD-10-CM | POA: Insufficient documentation

## 2018-02-24 ENCOUNTER — Ambulatory Visit: Payer: BC Managed Care – PPO | Admitting: Physician Assistant

## 2018-03-15 ENCOUNTER — Encounter (INDEPENDENT_AMBULATORY_CARE_PROVIDER_SITE_OTHER): Payer: Self-pay

## 2018-03-15 ENCOUNTER — Encounter: Payer: Self-pay | Admitting: Cardiology

## 2018-03-15 ENCOUNTER — Ambulatory Visit: Payer: BC Managed Care – PPO | Admitting: Cardiology

## 2018-03-15 VITALS — BP 102/80 | HR 67 | Ht 71.0 in | Wt 249.8 lb

## 2018-03-15 DIAGNOSIS — K219 Gastro-esophageal reflux disease without esophagitis: Secondary | ICD-10-CM

## 2018-03-15 DIAGNOSIS — I480 Paroxysmal atrial fibrillation: Secondary | ICD-10-CM | POA: Diagnosis not present

## 2018-03-15 MED ORDER — PANTOPRAZOLE SODIUM 40 MG PO TBEC: 40.0000 mg | DELAYED_RELEASE_TABLET | Freq: Every day | ORAL | 0 refills | Status: DC

## 2018-03-15 NOTE — Progress Notes (Signed)
Cardiology Office Note:    Date:  03/15/2018   ID:  Tanner Bottomshristopher Muradyan, DOB 1971/03/31, MRN [id number]  PCP:  Blair HeysEhinger, Robert, MD  Cardiologist:  Yvonne Kendallhristopher End, MD  Referring MD: Blair HeysEhinger, Robert, MD   Chief Complaint  Patient presents with  . Follow-up    afib and GERD    History of Present Illness:    Tanner Blevins is a 47 y.o. male with a past medical history significant for PAF, HTN and OSA.   His heart fluttering has resolved since last visit but he is having more acid reflux. He has been taking Zantac twice a day but occ wakes up with heartburn from sleep at night. He denies any chest tightness. 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 He gets 10-20,000 steps per day. Not feeling the gassy feeling that he had with the palpitations. He has started exercising and does 30 minuters of cradio on eliptical, recumbent bike or treadmill without exertional symptoms.   Reflux seemed to get worse after starting Zantac. It is occurring almost every day. He drinks a lot of diet sodas with caffeine, 2-4 X 16 oz, 1 cup of coffee. He does not smoke or drink alcohol regularly.   He has OSA and uses a dental device every night.   Past Medical History:  Diagnosis Date  . Hx of anorexia nervosa   . Ileus, postoperative (HCC) 04/18/2013  . Urinary retention with incomplete bladder emptying 04/18/2013   Post op    Past Surgical History:  Procedure Laterality Date  . LAPAROSCOPIC APPENDECTOMY N/A 04/15/2013   Procedure: APPENDECTOMY LAPAROSCOPIC;  Surgeon: Velora Hecklerodd M Gerkin, MD;  Location: [facility];  Service: General;  Laterality: N/A;  . NASAL SEPTUM SURGERY    . SINOSCOPY      Current Medications: Current Meds  Medication Sig  . acetaminophen (TYLENOL) 325 MG tablet Take 650 mg by mouth as directed.  Marland Kitchen. aspirin EC 81 MG tablet Take 1 tablet (81 mg total) daily by mouth.  . Azelastine-Fluticasone 137-50 MCG/ACT SUSP Place 1-2 sprays into the nose 2 (two) times daily.  . cetirizine  (ZYRTEC ALLERGY) 10 MG tablet Take 10 mg by mouth daily.  Marland Kitchen. diltiazem (CARDIZEM) 30 MG tablet Take 1 tablet (30 mg total) by mouth daily as needed (afib).  Marland Kitchen. lisinopril (PRINIVIL,ZESTRIL) 10 MG tablet Take 10 mg by mouth daily.  . Omega-3 Fatty Acids (FISH OIL PO) Take by mouth as directed.     Allergies:   Carbamazepine   Social History   Socioeconomic History  . Marital status: Married    Spouse name: Not on file  . Number of children: Not on file  . Years of education: Not on file  . Highest education level: Not on file  Occupational History  . Not on file  Social Needs  . Financial resource strain: Not on file  . Food insecurity:    Worry: Not on file    Inability: Not on file  . Transportation needs:    Medical: Not on file    Non-medical: Not on file  Tobacco Use  . Smoking status: Former Smoker    Packs/day: 0.25    Years: 15.00    Pack years: 3.75    Types: Cigarettes    Last attempt to quit: 08/08/2012    Years since quitting: 5.6  . Smokeless tobacco: Never Used  Substance and Sexual Activity  . Alcohol use: Yes    Comment: One drink per month  . Drug use:  No  . Sexual activity: Yes  Lifestyle  . Physical activity:    Days per week: Not on file    Minutes per session: Not on file  . Stress: Not on file  Relationships  . Social connections:    Talks on phone: Not on file    Gets together: Not on file    Attends religious service: Not on file    Active member of club or organization: Not on file    Attends meetings of clubs or organizations: Not on file    Relationship status: Not on file  Other Topics Concern  . Not on file  Social History Narrative  . Not on file     Family History: The patient's family history includes AAA (abdominal aortic aneurysm) in his father; Allergic rhinitis in his father; Healthy in his mother and sister; Heart attack in his maternal grandfather; Heart disease in his father; Transient ischemic attack in his father. There  is no history of Angioedema, Asthma, Eczema, Immunodeficiency, or Urticaria. ROS:   Please see the history of present illness.     All other systems reviewed and are negative.  EKGs/Labs/Other Studies Reviewed:    The following studies were reviewed today:  Echo 03/06/18 Study Conclusions - Left ventricle: The cavity size was normal. Wall thickness was   normal. Systolic function was normal. The estimated ejection   fraction was in the range of 55% to 60%. Wall motion was normal;   there were no regional wall motion abnormalities. Left   ventricular diastolic function parameters were normal.  Impressions: - Normal LV function; trace TR.  EKG:  EKG is not ordered today.   Recent Labs: No results found for requested labs within last 8760 hours.   Recent Lipid Panel No results found for: CHOL, TRIG, HDL, CHOLHDL, VLDL, LDLCALC, LDLDIRECT  Physical Exam:    VS:  BP 102/80   Pulse 67   Ht 5\' 11"  (1.803 m)   Wt 249 lb 12.8 oz (113.3 kg)   SpO2 97%   BMI 34.84 kg/m     Wt Readings from Last 3 Encounters:  03/15/18 249 lb 12.8 oz (113.3 kg)  01/15/18 245 lb 8 oz (111.4 kg)  05/29/17 243 lb (110.2 kg)     Physical Exam  Constitutional: He is oriented to person, place, and time. He appears well-developed and well-nourished. No distress.  HENT:  Head: Normocephalic and atraumatic.  Neck: Normal range of motion. Neck supple. No JVD present.  Cardiovascular: Normal rate, regular rhythm, normal heart sounds and intact distal pulses. Exam reveals no gallop.  No murmur heard. Pulmonary/Chest: Effort normal and breath sounds normal. No respiratory distress. He has no wheezes. He has no rales.  Abdominal: Soft. Bowel sounds are normal. There is no tenderness.  Musculoskeletal: Normal range of motion. He exhibits no edema or deformity.  Neurological: He is alert and oriented to person, place, and time.  Skin: Skin is warm and dry.  Psychiatric: He has a normal mood and affect.  His behavior is normal. Judgment and thought content normal.  Vitals reviewed.   ASSESSMENT:    1. Paroxysmal atrial fibrillation (HCC)   2. Gastroesophageal reflux disease, esophagitis presence not specified    PLAN:    In order of problems listed above:  GERD: At visit on 6/28 pt c/o fluttering and "gassy feeling". He started Zantac BID. The fluttering and gassy feeling resolved, but since starting the zantac he is having burning, reflux every day and often  wakes him from sleep. He drinks a lot of diet caffeinated sodas. We discussed dietary changes to reduce reflux. He would like to try stopping the zantac since his symptoms started with the initiation of zantac. I have given him 1 month of Protonix to try if his reflux does not improve and he is to see his PCP if it does not. The patient is very active without exertional chest discomfort or dypnea. He feels strongly that these symptoms are related to reflux and does not wish to pursue any ischemic testing.   Paroxysmal afib: Regular rate and rhythm on exam. No further palpitations or chest discomfort since visit on 6/28. Given CHADSVASC score of 1 (HTN), anticoagulation deferred.  Continue with as needed diltiazem.   Medication Adjustments/Labs and Tests Ordered: Current medicines are reviewed at length with the patient today.  Concerns regarding medicines are outlined above. Labs and tests ordered and medication changes are outlined in the patient instructions below:  Patient Instructions  Medication Instructions: START: Protonix 40 MG daily  Labwork: None Ordered  Procedures/Testing: None Ordered  Follow-Up: Your physician wants you to follow-up in: 6 months with Dr. Okey Dupre  You will receive a reminder letter in the mail two months in advance. If you don't receive a letter, please call our office to schedule the follow-up appointment.   Any Additional Special Instructions Will Be Listed Below (If  Applicable).   Gastroesophageal Reflux Disease, Adult Normally, food travels down the esophagus and stays in the stomach to be digested. If a person has gastroesophageal reflux disease (GERD), food and stomach acid move back up into the esophagus. When this happens, the esophagus becomes sore and swollen (inflamed). Over time, GERD can make small holes (ulcers) in the lining of the esophagus. Follow these instructions at home: Diet  Follow a diet as told by your doctor. You may need to avoid foods and drinks such as: ? Coffee and tea (with or without caffeine). ? Drinks that contain alcohol. ? Energy drinks and sports drinks. ? Carbonated drinks or sodas. ? Chocolate and cocoa. ? Peppermint and mint flavorings. ? Garlic and onions. ? Horseradish. ? Spicy and acidic foods, such as peppers, chili powder, curry powder, vinegar, hot sauces, and BBQ sauce. ? Citrus fruit juices and citrus fruits, such as oranges, lemons, and limes. ? Tomato-based foods, such as red sauce, chili, salsa, and pizza with red sauce. ? Fried and fatty foods, such as donuts, french fries, potato chips, and high-fat dressings. ? High-fat meats, such as hot dogs, rib eye steak, sausage, ham, and bacon. ? High-fat dairy items, such as whole milk, butter, and cream cheese.  Eat small meals often. Avoid eating large meals.  Avoid drinking large amounts of liquid with your meals.  Avoid eating meals during the 2-3 hours before bedtime.  Avoid lying down right after you eat.  Do not exercise right after you eat. General instructions  Pay attention to any changes in your symptoms.  Take over-the-counter and prescription medicines only as told by your doctor. Do not take aspirin, ibuprofen, or other NSAIDs unless your doctor says it is okay.  Do not use any tobacco products, including cigarettes, chewing tobacco, and e-cigarettes. If you need help quitting, ask your doctor.  Wear loose clothes. Do not wear  anything tight around your waist.  Raise (elevate) the head of your bed about 6 inches (15 cm).  Try to lower your stress. If you need help doing this, ask your doctor.  If you  are overweight, lose an amount of weight that is healthy for you. Ask your doctor about a safe weight loss goal.  Keep all follow-up visits as told by your doctor. This is important. Contact a doctor if:  You have new symptoms.  You lose weight and you do not know why it is happening.  You have trouble swallowing, or it hurts to swallow.  You have wheezing or a cough that keeps happening.  Your symptoms do not get better with treatment.  You have a hoarse voice. Get help right away if:  You have pain in your arms, neck, jaw, teeth, or back.  You feel sweaty, dizzy, or light-headed.  You have chest pain or shortness of breath.  You throw up (vomit) and your throw up looks like blood or coffee grounds.  You pass out (faint).  Your poop (stool) is bloody or black.  You cannot swallow, drink, or eat. This information is not intended to replace advice given to you by your health care provider. Make sure you discuss any questions you have with your health care provider. Document Released: 12/24/2007 Document Revised: 12/13/2015 Document Reviewed: 11/01/2014 Elsevier Interactive Patient Education  [employer].     If you need a refill on your cardiac medications before your next appointment, please call your pharmacy. '    Signed, Berton Bon, NP  03/15/2018 4:39 PM    [facility]

## 2018-03-15 NOTE — Patient Instructions (Addendum)
Medication Instructions: START: Protonix 40 MG daily  Labwork: None Ordered  Procedures/Testing: None Ordered  Follow-Up: Your physician wants you to follow-up in: 6 months with Dr. Okey Dupre  You will receive a reminder letter in the mail two months in advance. If you don't receive a letter, please call our office to schedule the follow-up appointment.   Any Additional Special Instructions Will Be Listed Below (If Applicable).   Gastroesophageal Reflux Disease, Adult Normally, food travels down the esophagus and stays in the stomach to be digested. If a person has gastroesophageal reflux disease (GERD), food and stomach acid move back up into the esophagus. When this happens, the esophagus becomes sore and swollen (inflamed). Over time, GERD can make small holes (ulcers) in the lining of the esophagus. Follow these instructions at home: Diet  Follow a diet as told by your doctor. You may need to avoid foods and drinks such as: ? Coffee and tea (with or without caffeine). ? Drinks that contain alcohol. ? Energy drinks and sports drinks. ? Carbonated drinks or sodas. ? Chocolate and cocoa. ? Peppermint and mint flavorings. ? Garlic and onions. ? Horseradish. ? Spicy and acidic foods, such as peppers, chili powder, curry powder, vinegar, hot sauces, and BBQ sauce. ? Citrus fruit juices and citrus fruits, such as oranges, lemons, and limes. ? Tomato-based foods, such as red sauce, chili, salsa, and pizza with red sauce. ? Fried and fatty foods, such as donuts, french fries, potato chips, and high-fat dressings. ? High-fat meats, such as hot dogs, rib eye steak, sausage, ham, and bacon. ? High-fat dairy items, such as whole milk, butter, and cream cheese.  Eat small meals often. Avoid eating large meals.  Avoid drinking large amounts of liquid with your meals.  Avoid eating meals during the 2-3 hours before bedtime.  Avoid lying down right after you eat.  Do not exercise right  after you eat. General instructions  Pay attention to any changes in your symptoms.  Take over-the-counter and prescription medicines only as told by your doctor. Do not take aspirin, ibuprofen, or other NSAIDs unless your doctor says it is okay.  Do not use any tobacco products, including cigarettes, chewing tobacco, and e-cigarettes. If you need help quitting, ask your doctor.  Wear loose clothes. Do not wear anything tight around your waist.  Raise (elevate) the head of your bed about 6 inches (15 cm).  Try to lower your stress. If you need help doing this, ask your doctor.  If you are overweight, lose an amount of weight that is healthy for you. Ask your doctor about a safe weight loss goal.  Keep all follow-up visits as told by your doctor. This is important. Contact a doctor if:  You have new symptoms.  You lose weight and you do not know why it is happening.  You have trouble swallowing, or it hurts to swallow.  You have wheezing or a cough that keeps happening.  Your symptoms do not get better with treatment.  You have a hoarse voice. Get help right away if:  You have pain in your arms, neck, jaw, teeth, or back.  You feel sweaty, dizzy, or light-headed.  You have chest pain or shortness of breath.  You throw up (vomit) and your throw up looks like blood or coffee grounds.  You pass out (faint).  Your poop (stool) is bloody or black.  You cannot swallow, drink, or eat. This information is not intended to replace advice given to you  by your health care provider. Make sure you discuss any questions you have with your health care provider. Document Released: 12/24/2007 Document Revised: 12/13/2015 Document Reviewed: 11/01/2014 Elsevier Interactive Patient Education  [employer]2018 [employer]     If you need a refill on your cardiac medications before your next appointment, please call your pharmacy. '

## 2018-08-30 NOTE — Progress Notes (Signed)
Cardiology Office Note   Date:  09/02/2018   ID:  Tanner Blevins, DOB 1971-03-27, MRN [id number]  PCP:  Tanner Heys, MD  Cardiologist:   Tanner Swaziland, MD   Chief Complaint  Patient presents with  . Palpitations  . Atrial Fibrillation      History of Present Illness: [name] is a 48 y.o. male who presents for follow up paroxysmal atrial fibrillation. Formerly followed by Dr. Okey Blevins. Also has a history of OSA and HTN. In mid August 2018, he first noticed palpitations with a fast and irregular heartbeat while sitting at home.  The episode lasted for a few hours and ultimately prompted him to come to the emergency department for further evaluation.  Arrangements were made for cardioversion in the ED, though he spontaneously noted to normal sinus rhythm before cardioversion was performed.  He was discharged on rivaroxaban and followed up 2 days later in the atrial fibrillation clinic.  He was found to be in sinus rhythm at that time.  He was advised to continue with rivaroxaban; he completed a one-month course of this. Echo was unremarkable. On subsequent follow up no recurrent Afib noted.   On follow up today he notes an episode of Afib about one month ago. Awoke at 6 am and was out of rhythm. Felt palpitations and anxious. Mild SOB. Took diltiazem and converted to NSR around 1 pm. States this is the first episode he has had in a year. He does have mild sleep apnea managed with an oral device. Otherwise feels well.     Past Medical History:  Diagnosis Date  . Hx of anorexia nervosa   . Ileus, postoperative (HCC) 04/18/2013  . Urinary retention with incomplete bladder emptying 04/18/2013   Post op    Past Surgical History:  Procedure Laterality Date  . LAPAROSCOPIC APPENDECTOMY N/A 04/15/2013   Procedure: APPENDECTOMY LAPAROSCOPIC;  Surgeon: Tanner Heckler, MD;  Location: [facility];  Service: General;  Laterality: N/A;  . NASAL SEPTUM SURGERY    . SINOSCOPY        Current Outpatient Medications  Medication Sig Dispense Refill  . acetaminophen (TYLENOL) 325 MG tablet Take 650 mg by mouth as directed.    Marland Kitchen aspirin EC 81 MG tablet Take 1 tablet (81 mg total) daily by mouth.    . Azelastine-Fluticasone 137-50 MCG/ACT SUSP Place 1-2 sprays into the nose 2 (two) times daily. 23 g 5  . cetirizine (ZYRTEC ALLERGY) 10 MG tablet Take 10 mg by mouth daily.    Marland Kitchen diltiazem (CARDIZEM) 30 MG tablet Take 1 tablet (30 mg total) by mouth daily as needed (afib). 10 tablet 6  . lisinopril (PRINIVIL,ZESTRIL) 10 MG tablet Take 10 mg by mouth daily.    . Omega-3 Fatty Acids (FISH OIL PO) Take by mouth as directed.    . pantoprazole (PROTONIX) 40 MG tablet Take 1 tablet (40 mg total) by mouth daily. 30 tablet 0   No current facility-administered medications for this visit.     Allergies:   Carbamazepine    Social History:  The patient  reports that he quit smoking about 6 years ago. His smoking use included cigarettes. He has a 3.75 pack-year smoking history. He has never used smokeless tobacco. He reports current alcohol use. He reports that he does not use drugs.   Family History:  The patient's family history includes AAA (abdominal aortic aneurysm) in his father; Allergic rhinitis in his father; Healthy in his mother and sister; Heart attack  in his maternal grandfather; Heart disease in his father; Transient ischemic attack in his father.    ROS:  Please see the history of present illness.   Otherwise, review of systems are positive for none.   All other systems are reviewed and negative.    PHYSICAL EXAM: VS:  BP 114/70   Pulse 65   Ht 5\' 11"  (1.803 m)   Wt 243 lb (110.2 kg)   BMI 33.89 kg/m  , BMI Body mass index is 33.89 kg/m. GEN: Well nourished, overweight,  in no acute distress  HEENT: normal  Neck: no JVD, carotid bruits, or masses Cardiac: RRR; no murmurs, rubs, or gallops,no edema  Respiratory:  clear to auscultation bilaterally, normal work  of breathing GI: soft, nontender, nondistended, + BS MS: no deformity or atrophy  Skin: warm and dry, no rash Neuro:  Strength and sensation are intact Psych: euthymic mood, full affect   EKG:  EKG is ordered today. The ekg ordered today demonstrates NSR rate 65. Normal. I have personally reviewed and interpreted this study.    Recent Labs: No results found for requested labs within last 8760 hours.    Lipid Panel No results found for: CHOL, TRIG, HDL, CHOLHDL, VLDL, LDLCALC, LDLDIRECT   Labs dated 08/03/17: cholesterol 215, triglycerides 224, HDL 31, LDL 139. CBC, Chemistries  normal.    Wt Readings from Last 3 Encounters:  09/02/18 243 lb (110.2 kg)  03/15/18 249 lb 12.8 oz (113.3 kg)  01/15/18 245 lb 8 oz (111.4 kg)      Other studies Reviewed: Additional studies/ records that were reviewed today include:   Echo 03/06/17: Study Conclusions  - Left ventricle: The cavity size was normal. Wall thickness was   normal. Systolic function was normal. The estimated ejection   fraction was in the range of 55% to 60%. Wall motion was normal;   there were no regional wall motion abnormalities. Left   ventricular diastolic function parameters were normal.  Impressions:  - Normal LV function; trace TR.   ASSESSMENT AND PLAN:  1.  Paroxysmal AFib. Rare episodes. Taking diltiazem prn. On ASA. Have deferred anticoagulation since [place] vasc score is 1. Will follow up in one year.   Current medicines are reviewed at length with the patient today.  The patient does not have concerns regarding medicines.  The following changes have been made:  no change  Labs/ tests ordered today include: none  Orders Placed This Encounter  Procedures  . EKG 12-Lead     Disposition:   FU with me in 1 year  Signed, Tanner Swaziland, MD  09/02/2018 9:09 AM    [facility] [address] [place], [place], [address] Phone [phone], Fax [phone]

## 2018-09-02 ENCOUNTER — Encounter: Payer: Self-pay | Admitting: Cardiology

## 2018-09-02 ENCOUNTER — Ambulatory Visit: Payer: BC Managed Care – PPO | Admitting: Cardiology

## 2018-09-02 VITALS — BP 114/70 | HR 65 | Ht 71.0 in | Wt 243.0 lb

## 2018-09-02 DIAGNOSIS — I10 Essential (primary) hypertension: Secondary | ICD-10-CM

## 2018-09-02 DIAGNOSIS — I480 Paroxysmal atrial fibrillation: Secondary | ICD-10-CM | POA: Diagnosis not present

## 2018-09-02 MED ORDER — DILTIAZEM HCL 30 MG PO TABS: 30.0000 mg | ORAL_TABLET | Freq: Every day | ORAL | 6 refills | Status: DC | PRN

## 2019-09-11 NOTE — Progress Notes (Signed)
Cardiology Office Note   Date:  09/15/2019   ID:  Tanner Blevins, DOB March 01, 1971, MRN [id number]  PCP:  Blair Heys, MD  Cardiologist:   Maxxon Schwanke [place], MD   Chief Complaint  Patient presents with  . Follow-up    12 months.      History of Present Illness: Tanner Blevins is a 49 y.o. male who presents for follow up paroxysmal atrial fibrillation. Formerly followed by Dr. Okey Dupre. Also has a history of OSA and HTN. In mid August 2018, he first noticed palpitations with a fast and irregular heartbeat while sitting at home.  The episode lasted for a few hours and ultimately prompted him to come to the emergency department for further evaluation.  Arrangements were made for cardioversion in the ED, though he spontaneously noted to normal sinus rhythm before cardioversion was performed.  He was discharged on rivaroxaban and followed up 2 days later in the atrial fibrillation clinic.  He was found to be in sinus rhythm at that time.  He was advised to continue with rivaroxaban; he completed a one-month course of this. Echo was unremarkable. On subsequent follow up no recurrent Afib noted.   On follow up today he can only think of one episode of Afib several months ago. Resolved after a few hours on its own. He is active (teaches PE)     Past Medical History:  Diagnosis Date  . Hx of anorexia nervosa   . Ileus, postoperative (HCC) 04/18/2013  . Urinary retention with incomplete bladder emptying 04/18/2013   Post op    Past Surgical History:  Procedure Laterality Date  . LAPAROSCOPIC APPENDECTOMY N/A 04/15/2013   Procedure: APPENDECTOMY LAPAROSCOPIC;  Surgeon: Velora Heckler, MD;  Location: [facility];  Service: General;  Laterality: N/A;  . NASAL SEPTUM SURGERY    . SINOSCOPY       Current Outpatient Medications  Medication Sig Dispense Refill  . acetaminophen (TYLENOL) 325 MG tablet Take 650 mg by mouth every 4 (four) hours as needed.     Marland Kitchen aspirin EC 81 MG tablet Take 1  tablet (81 mg total) daily by mouth.    . Azelastine-Fluticasone 137-50 MCG/ACT SUSP Place 1-2 sprays into the nose 2 (two) times daily. 23 g 5  . cetirizine (ZYRTEC ALLERGY) 10 MG tablet Take 10 mg by mouth daily.    Marland Kitchen diltiazem (CARDIZEM) 30 MG tablet Take 1 tablet (30 mg total) by mouth daily as needed (afib). 10 tablet 6  . lisinopril (PRINIVIL,ZESTRIL) 10 MG tablet Take 10 mg by mouth daily.    . Omega-3 Fatty Acids (FISH OIL PO) Take by mouth as directed.     No current facility-administered medications for this visit.    Allergies:   Carbamazepine    Social History:  The patient  reports that he quit smoking about 7 years ago. His smoking use included cigarettes. He has a 3.75 pack-year smoking history. He has never used smokeless tobacco. He reports current alcohol use. He reports that he does not use drugs.   Family History:  The patient's family history includes AAA (abdominal aortic aneurysm) in his father; Allergic rhinitis in his father; Healthy in his mother and sister; Heart attack in his maternal grandfather; Heart disease in his father; Transient ischemic attack in his father.    ROS:  Please see the history of present illness.   Otherwise, review of systems are positive for none.   All other systems are reviewed and negative.  PHYSICAL EXAM: VS:  BP 110/72 (BP Location: Left Arm, Patient Position: Sitting, Cuff Size: Normal)   Pulse 66   Temp (!) 97 F (36.1 C)   Ht 5\' 11"  (1.803 m)   Wt 245 lb (111.1 kg)   BMI 34.17 kg/m  , BMI Body mass index is 34.17 kg/m. GEN: Well nourished, overweight,  in no acute distress  HEENT: normal  Neck: no JVD, carotid bruits, or masses Cardiac: RRR; no murmurs, rubs, or gallops,no edema  Respiratory:  clear to auscultation bilaterally, normal work of breathing GI: soft, nontender, nondistended, + BS MS: no deformity or atrophy  Skin: warm and dry, no rash Neuro:  Strength and sensation are intact Psych: euthymic mood, full  affect   EKG:  EKG is ordered today. The ekg ordered today demonstrates NSR rate 66 Normal. I have personally reviewed and interpreted this study.    Recent Labs: No results found for requested labs within last 8760 hours.    Lipid Panel No results found for: CHOL, TRIG, HDL, CHOLHDL, VLDL, LDLCALC, LDLDIRECT   Labs dated 08/03/17: cholesterol 215, triglycerides 224, HDL 31, LDL 139. CBC, Chemistries  Normal. Dated 09/02/18: cholesterol 221, triglycerides 210, HDL 34, LDL 146. CMET normal.    Wt Readings from Last 3 Encounters:  09/15/19 245 lb (111.1 kg)  09/02/18 243 lb (110.2 kg)  03/15/18 249 lb 12.8 oz (113.3 kg)      Other studies Reviewed: Additional studies/ records that were reviewed today include:   Echo 03/06/17: Study Conclusions  - Left ventricle: The cavity size was normal. Wall thickness was   normal. Systolic function was normal. The estimated ejection   fraction was in the range of 55% to 60%. Wall motion was normal;   there were no regional wall motion abnormalities. Left   ventricular diastolic function parameters were normal.  Impressions:  - Normal LV function; trace TR.   ASSESSMENT AND PLAN:  1.  Paroxysmal AFib. Rare episodes. Taking diltiazem prn. On ASA. Have deferred anticoagulation since [place] vasc score is 1. Will follow up in one year.   Current medicines are reviewed at length with the patient today.  The patient does not have concerns regarding medicines.  The following changes have been made:  no change  Labs/ tests ordered today include: none  Orders Placed This Encounter  Procedures  . EKG 12-Lead     Disposition:   FU with me in 1 year  Signed, Calton Harshfield [place], MD  09/15/2019 4:40 PM    [facility] [address] [place], [place], [address] Phone [phone], Fax [phone]

## 2019-09-15 ENCOUNTER — Other Ambulatory Visit: Payer: Self-pay

## 2019-09-15 ENCOUNTER — Ambulatory Visit: Payer: BC Managed Care – PPO | Admitting: Cardiology

## 2019-09-15 ENCOUNTER — Encounter: Payer: Self-pay | Admitting: Cardiology

## 2019-09-15 VITALS — BP 110/72 | HR 66 | Temp 97.0°F | Ht 71.0 in | Wt 245.0 lb

## 2019-09-15 DIAGNOSIS — I10 Essential (primary) hypertension: Secondary | ICD-10-CM | POA: Diagnosis not present

## 2019-09-15 DIAGNOSIS — I480 Paroxysmal atrial fibrillation: Secondary | ICD-10-CM

## 2019-09-15 MED ORDER — DILTIAZEM HCL 30 MG PO TABS: 30.0000 mg | ORAL_TABLET | Freq: Every day | ORAL | 6 refills | Status: DC | PRN

## 2020-09-28 ENCOUNTER — Ambulatory Visit: Payer: BC Managed Care – PPO | Admitting: Cardiology

## 2020-10-25 NOTE — Progress Notes (Signed)
d    Cardiology Office Note   Date:  10/26/2020   ID:  Tanner Blevins, DOB Dec 18, 1970, MRN [id number]  PCP:  Tanner Heys, MD  Cardiologist:   Tanner Dibble [place], MD   Chief Complaint  Patient presents with  . Atrial Fibrillation      History of Present Illness: Tanner Blevins is a 50 y.o. male who presents for follow up paroxysmal atrial fibrillation. Formerly followed by Dr. Okey Dupre. Also has a history of OSA and HTN. In mid August 2018, he first noticed palpitations with a fast and irregular heartbeat while sitting at home.  The episode lasted for a few hours and ultimately prompted him to come to the emergency department for further evaluation.  Arrangements were made for cardioversion in the ED, though he spontaneously noted to normal sinus rhythm before cardioversion was performed.  He was discharged on rivaroxaban and followed up 2 days later in the atrial fibrillation clinic.  He was found to be in sinus rhythm at that time.  He was advised to continue with rivaroxaban; he completed a one-month course of this. Echo was unremarkable. On subsequent follow up no recurrent Afib noted.   On follow up today he is doing well. Notes only one episode of Afib back in the fall. Awoke with it. It lasted several hours. He took diltiazem and if finally converted. Only having about one episode a year. No other health concerns.     Past Medical History:  Diagnosis Date  . Hx of anorexia nervosa   . Ileus, postoperative (HCC) 04/18/2013  . Urinary retention with incomplete bladder emptying 04/18/2013   Post op    Past Surgical History:  Procedure Laterality Date  . LAPAROSCOPIC APPENDECTOMY N/A 04/15/2013   Procedure: APPENDECTOMY LAPAROSCOPIC;  Surgeon: Velora Heckler, MD;  Location: [facility];  Service: General;  Laterality: N/A;  . NASAL SEPTUM SURGERY    . SINOSCOPY       Current Outpatient Medications  Medication Sig Dispense Refill  . acetaminophen (TYLENOL) 325 MG tablet Take 650 mg  by mouth every 4 (four) hours as needed.     Marland Kitchen aspirin EC 81 MG tablet Take 1 tablet (81 mg total) daily by mouth.    . Azelastine-Fluticasone 137-50 MCG/ACT SUSP Place 1-2 sprays into the nose 2 (two) times daily. 23 g 5  . cetirizine (ZYRTEC) 10 MG tablet Take 10 mg by mouth daily.    Marland Kitchen diltiazem (CARDIZEM) 30 MG tablet Take 1 tablet (30 mg total) by mouth daily as needed (afib). 10 tablet 6  . lisinopril (PRINIVIL,ZESTRIL) 10 MG tablet Take 10 mg by mouth daily.    . Omega-3 Fatty Acids (FISH OIL PO) Take by mouth as directed.     No current facility-administered medications for this visit.    Allergies:   Carbamazepine    Social History:  The patient  reports that he quit smoking about 8 years ago. His smoking use included cigarettes. He has a 3.75 pack-year smoking history. He has never used smokeless tobacco. He reports current alcohol use. He reports that he does not use drugs.   Family History:  The patient's family history includes AAA (abdominal aortic aneurysm) in his father; Allergic rhinitis in his father; Healthy in his mother and sister; Heart attack in his maternal grandfather; Heart disease in his father; Transient ischemic attack in his father.    ROS:  Please see the history of present illness.   Otherwise, review of systems are positive for none.  All other systems are reviewed and negative.    PHYSICAL EXAM: VS:  BP 112/80 (BP Location: Right Arm, Patient Position: Sitting, Cuff Size: Normal)   Pulse 60   Ht 5' 10.5" (1.791 m)   Wt 241 lb (109.3 kg)   SpO2 97%   BMI 34.09 kg/m  , BMI Body mass index is 34.09 kg/m. GEN: Well nourished, overweight,  in no acute distress  HEENT: normal  Neck: no JVD, carotid bruits, or masses Cardiac: RRR; no murmurs, rubs, or gallops,no edema  Respiratory:  clear to auscultation bilaterally, normal work of breathing GI: soft, nontender, nondistended, + BS MS: no deformity or atrophy  Skin: warm and dry, no rash Neuro:   Strength and sensation are intact Psych: euthymic mood, full affect   EKG:  EKG is ordered today. The ekg ordered today demonstrates NSR rate 60 Normal. I have personally reviewed and interpreted this study.    Recent Labs: No results found for requested labs within last 8760 hours.    Lipid Panel No results found for: CHOL, TRIG, HDL, CHOLHDL, VLDL, LDLCALC, LDLDIRECT   Labs dated 08/03/17: cholesterol 215, triglycerides 224, HDL 31, LDL 139. CBC, Chemistries  Normal. Dated 09/02/18: cholesterol 221, triglycerides 210, HDL 34, LDL 146. CMET normal.  Dated 10/12/20: cholesterol 259, triglycerides 176, HDL 38, LDL 188. BMET normal.    Wt Readings from Last 3 Encounters:  10/26/20 241 lb (109.3 kg)  09/15/19 245 lb (111.1 kg)  09/02/18 243 lb (110.2 kg)      Other studies Reviewed: Additional studies/ records that were reviewed today include:   Echo 03/06/17: Study Conclusions  - Left ventricle: The cavity size was normal. Wall thickness was   normal. Systolic function was normal. The estimated ejection   fraction was in the range of 55% to 60%. Wall motion was normal;   there were no regional wall motion abnormalities. Left   ventricular diastolic function parameters were normal.  Impressions:  - Normal LV function; trace TR.   ASSESSMENT AND PLAN:  1.  Paroxysmal AFib. Rare episodes. Taking diltiazem prn. On ASA. Have deferred anticoagulation since [place] vasc score is 1. Will follow up in one year. 2. HTN controlled. 3. HLD. Cholesterol has increased significantly without change in weight or diet. He is planning on having labs repeated in June with primary care. If it is staying this high he should go on statin therapy.   Current medicines are reviewed at length with the patient today.  The patient does not have concerns regarding medicines.  The following changes have been made:  no change  Labs/ tests ordered today include: none  No orders of the defined types  were placed in this encounter.    Disposition:   FU with me in 1 year  Signed, Marla Pouliot [place], MD  10/26/2020 3:57 PM    [facility] [address], [place], [place], [address] Phone [phone], Fax [phone]

## 2020-10-26 ENCOUNTER — Ambulatory Visit: Payer: BC Managed Care – PPO | Admitting: Cardiology

## 2020-10-26 ENCOUNTER — Other Ambulatory Visit: Payer: Self-pay

## 2020-10-26 ENCOUNTER — Encounter: Payer: Self-pay | Admitting: Cardiology

## 2020-10-26 VITALS — BP 112/80 | HR 60 | Ht 70.5 in | Wt 241.0 lb

## 2020-10-26 DIAGNOSIS — E7800 Pure hypercholesterolemia, unspecified: Secondary | ICD-10-CM | POA: Diagnosis not present

## 2020-10-26 DIAGNOSIS — I480 Paroxysmal atrial fibrillation: Secondary | ICD-10-CM | POA: Diagnosis not present

## 2020-10-26 DIAGNOSIS — I10 Essential (primary) hypertension: Secondary | ICD-10-CM | POA: Diagnosis not present

## 2022-04-06 NOTE — Progress Notes (Unsigned)
d    Cardiology Office Note   Date:  04/09/2022   ID:  Tanner Blevins, DOB 10-07-1970, MRN [id number]  PCP:  Tanner Heys, MD  Cardiologist:   Tanner Fera [place], MD   Chief Complaint  Patient presents with   Atrial Fibrillation      History of Present Illness: Tanner Blevins is a 51 y.o. male who presents for follow up paroxysmal atrial fibrillation. Formerly followed by Dr. Okey Blevins. Also has a history of OSA and HTN. In mid August 2018, he first noticed palpitations with a fast and irregular heartbeat while sitting at home.  The episode lasted for a few hours and ultimately prompted him to come to the emergency department for further evaluation.  Arrangements were made for cardioversion in the ED, though he spontaneously noted to normal sinus rhythm before cardioversion was performed.  He was discharged on rivaroxaban and followed up 2 days later in the atrial fibrillation clinic.  He was found to be in sinus rhythm at that time.  He was advised to continue with rivaroxaban; he completed a one-month course of this. Echo was unremarkable. On subsequent follow up no recurrent Afib noted.   On follow up today he is doing well. He has not had any Afib in the past year.  He has started on Crestor for his cholesterol     Past Medical History:  Diagnosis Date   Hx of anorexia nervosa    Ileus, postoperative (HCC) 04/18/2013   Urinary retention with incomplete bladder emptying 04/18/2013   Post op    Past Surgical History:  Procedure Laterality Date   LAPAROSCOPIC APPENDECTOMY N/A 04/15/2013   Procedure: APPENDECTOMY LAPAROSCOPIC;  Surgeon: Tanner Heckler, MD;  Location: [facility];  Service: General;  Laterality: N/A;   NASAL SEPTUM SURGERY     SINOSCOPY       Current Outpatient Medications  Medication Sig Dispense Refill   acetaminophen (TYLENOL) 325 MG tablet Take 650 mg by mouth every 4 (four) hours as needed.      aspirin EC 81 MG tablet Take 1 tablet (81 mg total) daily by mouth.      cetirizine (ZYRTEC) 10 MG tablet Take 10 mg by mouth daily.     fluticasone (FLONASE) 50 MCG/ACT nasal spray SMARTSIG:2 Spray(s) Both Nares Every Night     lisinopril (PRINIVIL,ZESTRIL) 10 MG tablet Take 10 mg by mouth daily.     Omega-3 Fatty Acids (FISH OIL PO) Take by mouth as directed.     rosuvastatin (CRESTOR) 10 MG tablet Take 10 mg by mouth daily.     diltiazem (CARDIZEM) 30 MG tablet Take 1 tablet (30 mg total) by mouth daily as needed (afib). 10 tablet 6   No current facility-administered medications for this visit.    Allergies:   Carbamazepine    Social History:  The patient  reports that he quit smoking about 9 years ago. His smoking use included cigarettes. He has a 3.75 pack-year smoking history. He has never used smokeless tobacco. He reports current alcohol use. He reports that he does not use drugs.   Family History:  The patient's family history includes AAA (abdominal aortic aneurysm) in his father; Allergic rhinitis in his father; Healthy in his mother and sister; Heart attack in his maternal grandfather; Heart disease in his father; Transient ischemic attack in his father.    ROS:  Please see the history of present illness.   Otherwise, review of systems are positive for none.   All other systems  are reviewed and negative.    PHYSICAL EXAM: VS:  BP 115/68   Pulse (!) 59   Ht 5\' 11"  (1.803 m)   Wt 242 lb 9.6 oz (110 kg)   SpO2 96%   BMI 33.84 kg/m  , BMI Body mass index is 33.84 kg/m. GEN: Well nourished, overweight,  in no acute distress  HEENT: normal  Neck: no JVD, carotid bruits, or masses Cardiac: RRR; no murmurs, rubs, or gallops,no edema  Respiratory:  clear to auscultation bilaterally, normal work of breathing GI: soft, nontender, nondistended, + BS MS: no deformity or atrophy  Skin: warm and dry, no rash Neuro:  Strength and sensation are intact Psych: euthymic mood, full affect   EKG:  EKG is ordered today. The ekg ordered today  demonstrates NSR rate 59 Normal. I have personally reviewed and interpreted this study.    Recent Labs: No results found for requested labs within last 365 days.    Lipid Panel No results found for: "CHOL", "TRIG", "HDL", "CHOLHDL", "VLDL", "LDLCALC", "LDLDIRECT"   Labs dated 08/03/17: cholesterol 215, triglycerides 224, HDL 31, LDL 139. CBC, Chemistries  Normal. Dated 09/02/18: cholesterol 221, triglycerides 210, HDL 34, LDL 146. CMET normal.  Dated 10/12/20: cholesterol 259, triglycerides 176, HDL 38, LDL 188. BMET normal.  Dated 10/14/21: cholesterol 143, triglycerides 153, HDL 36, LDL 80. CMET normal.    Wt Readings from Last 3 Encounters:  04/09/22 242 lb 9.6 oz (110 kg)  10/26/20 241 lb (109.3 kg)  09/15/19 245 lb (111.1 kg)      Other studies Reviewed: Additional studies/ records that were reviewed today include:   Echo 03/06/17: Study Conclusions   - Left ventricle: The cavity size was normal. Wall thickness was   normal. Systolic function was normal. The estimated ejection   fraction was in the range of 55% to 60%. Wall motion was normal;   there were no regional wall motion abnormalities. Left   ventricular diastolic function parameters were normal.   Impressions:   - Normal LV function; trace TR.   ASSESSMENT AND PLAN:  1.  Paroxysmal AFib. Rare episodes. Has diltiazem to use  prn. On ASA. Have deferred anticoagulation since [place] vasc score is 1. Will follow up in one year. 2. HTN controlled. 3. HLD. Marked improvement on Crestor. LDL 80.    Current medicines are reviewed at length with the patient today.  The patient does not have concerns regarding medicines.  The following changes have been made:  no change  Labs/ tests ordered today include: none  Orders Placed This Encounter  Procedures   EKG 12-Lead     Disposition:   FU with me in 1 year  Signed, Tanner Sebring [place], MD  04/09/2022 4:02 PM    [facility] [address]  [place], [place], [address] Phone [phone], Fax [phone]

## 2022-04-09 ENCOUNTER — Ambulatory Visit: Payer: BC Managed Care – PPO | Attending: Cardiology | Admitting: Cardiology

## 2022-04-09 ENCOUNTER — Encounter: Payer: Self-pay | Admitting: Cardiology

## 2022-04-09 VITALS — BP 115/68 | HR 59 | Ht 71.0 in | Wt 242.6 lb

## 2022-04-09 DIAGNOSIS — I10 Essential (primary) hypertension: Secondary | ICD-10-CM

## 2022-04-09 DIAGNOSIS — I480 Paroxysmal atrial fibrillation: Secondary | ICD-10-CM | POA: Diagnosis not present

## 2022-04-09 DIAGNOSIS — E7800 Pure hypercholesterolemia, unspecified: Secondary | ICD-10-CM

## 2022-04-09 MED ORDER — DILTIAZEM HCL 30 MG PO TABS: 30.0000 mg | ORAL_TABLET | Freq: Every day | ORAL | 6 refills | Status: AC | PRN

## 2023-07-13 ENCOUNTER — Ambulatory Visit
Admission: RE | Admit: 2023-07-13 | Discharge: 2023-07-13 | Disposition: A | Discharge status: Home / Self Care | Payer: BC Managed Care – PPO | Source: Ambulatory Visit | Attending: Physician Assistant | Admitting: Physician Assistant

## 2023-07-13 ENCOUNTER — Other Ambulatory Visit: Payer: Self-pay | Admitting: Physician Assistant

## 2023-07-13 DIAGNOSIS — M2550 Pain in unspecified joint: Secondary | ICD-10-CM

## 2023-08-11 NOTE — Progress Notes (Unsigned)
d    Cardiology Office Note   Date:  08/11/2023   ID:  Tanner Blevins, DOB [birth year], MRN [id number]  PCP:  Tanner Heys, MD (Inactive)  Cardiologist:   Tanner Cloyd [place], MD   No chief complaint on file.     History of Present Illness: Tanner Blevins is a 53 y.o. male who presents for follow up paroxysmal atrial fibrillation. Formerly followed by Dr. Okey Blevins. Also has a history of OSA and HTN. In mid August 2018, he first noticed palpitations with a fast and irregular heartbeat while sitting at home.  The episode lasted for a few hours and ultimately prompted him to come to the emergency department for further evaluation.  Arrangements were made for cardioversion in the ED, though he spontaneously noted to normal sinus rhythm before cardioversion was performed.  He was discharged on rivaroxaban and followed up 2 days later in the atrial fibrillation clinic.  He was found to be in sinus rhythm at that time.  He was advised to continue with rivaroxaban; he completed a one-month course of this. Echo was unremarkable. On subsequent follow up no recurrent Afib noted.   On follow up today he is doing well. He has not had any Afib in the past year.  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    Past Medical History:  Diagnosis Date   Hx of anorexia nervosa    Ileus, postoperative (HCC) 04/18/2013   Urinary retention with incomplete bladder emptying 04/18/2013   Post op    Past Surgical History:  Procedure Laterality Date   LAPAROSCOPIC APPENDECTOMY N/A 04/15/2013   Procedure: APPENDECTOMY LAPAROSCOPIC;  Surgeon: Tanner Heckler, MD;  Location: [facility];  Service: General;  Laterality: N/A;   NASAL SEPTUM SURGERY     SINOSCOPY       Current Outpatient Medications  Medication Sig Dispense Refill   acetaminophen (TYLENOL) 325 MG tablet Take 650 mg by mouth every 4 (four) hours as needed.      aspirin EC 81 MG tablet Take 1 tablet (81 mg total) daily by mouth.     cetirizine (ZYRTEC) 10  MG tablet Take 10 mg by mouth daily.     diltiazem (CARDIZEM) 30 MG tablet Take 1 tablet (30 mg total) by mouth daily as needed (afib). 10 tablet 6   fluticasone (FLONASE) 50 MCG/ACT nasal spray SMARTSIG:2 Spray(s) Both Nares Every Night     lisinopril (PRINIVIL,ZESTRIL) 10 MG tablet Take 10 mg by mouth daily.     Omega-3 Fatty Acids (FISH OIL PO) Take by mouth as directed.     rosuvastatin (CRESTOR) 10 MG tablet Take 10 mg by mouth daily.     No current facility-administered medications for this visit.    Allergies:   Carbamazepine    Social History:  The patient  reports that he quit smoking about 11 years ago. His smoking use included cigarettes. He started smoking about 26 years ago. He has a 3.8 pack-year smoking history. He has never used smokeless tobacco. He reports current alcohol use. He reports that he does not use drugs.   Family History:  The patient's family history includes AAA (abdominal aortic aneurysm) in his father; Allergic rhinitis in his father; Healthy in his mother and sister; Heart attack in his maternal grandfather; Heart disease in his father; Transient ischemic attack in his father.    ROS:  Please see the history of present illness.   Otherwise, review of systems are positive for none.   All other  systems are reviewed and negative.    PHYSICAL EXAM: VS:  There were no vitals taken for this visit. , BMI There is no height or weight on file to calculate BMI. GEN: Well nourished, overweight,  in no acute distress  HEENT: normal  Neck: no JVD, carotid bruits, or masses Cardiac: RRR; no murmurs, rubs, or gallops,no edema  Respiratory:  clear to auscultation bilaterally, normal work of breathing GI: soft, nontender, nondistended, + BS MS: no deformity or atrophy  Skin: warm and dry, no rash Neuro:  Strength and sensation are intact Psych: euthymic mood, full affect   EKG Interpretation Date/Time:  Wednesday August 12 2023 16:18:24 EST Ventricular Rate:   62 PR Interval:  178 QRS Duration:  88 QT Interval:  388 QTC Calculation: 393 R Axis:   4  Text Interpretation: Normal sinus rhythm Normal ECG When compared with ECG of Sept 22, 2023 No significant change was found Confirmed by Blevins, Tanner Biddinger [phone]) on 08/12/2023 4:33:10 PM     Recent Labs: No results found for requested labs within last 365 days.    Lipid Panel No results found for: "CHOL", "TRIG", "HDL", "CHOLHDL", "VLDL", "LDLCALC", "LDLDIRECT"   Labs dated 08/03/17: cholesterol 215, triglycerides 224, HDL 31, LDL 139. CBC, Chemistries  Normal. Dated 09/02/18: cholesterol 221, triglycerides 210, HDL 34, LDL 146. CMET normal.  Dated 10/12/20: cholesterol 259, triglycerides 176, HDL 38, LDL 188. BMET normal.  Dated 10/14/21: cholesterol 143, triglycerides 153, HDL 36, LDL 80. CMET normal.  Dated 10/16/22: cholesterol 90, triglycerides 175, HDL 26, LDL 35. CMET normal  Wt Readings from Last 3 Encounters:  04/09/22 242 lb 9.6 oz (110 kg)  10/26/20 241 lb (109.3 kg)  09/15/19 245 lb (111.1 kg)      Other studies Reviewed: Additional studies/ records that were reviewed today include:   Echo 03/06/17: Study Conclusions   - Left ventricle: The cavity size was normal. Wall thickness was   normal. Systolic function was normal. The estimated ejection   fraction was in the range of 55% to 60%. Wall motion was normal;   there were no regional wall motion abnormalities. Left   ventricular diastolic function parameters were normal.   Impressions:   - Normal LV function; trace TR.   ASSESSMENT AND PLAN:  1.  Paroxysmal AFib. None in past year. Has diltiazem to use  prn. On ASA. Have deferred anticoagulation since [place] vasc score is 1. Will follow up in one year. 2. HTN controlled. 3. HLD. Marked improvement on Crestor. LDL 35   Current medicines are reviewed at length with the patient today.  The patient does not have concerns regarding medicines.  The following changes have been  made:  no change  Labs/ tests ordered today include: none  No orders of the defined types were placed in this encounter.    Disposition:   FU with me in 1 year  Signed, Tanner Stettler [place], MD  08/11/2023 8:27 AM    [facility] [address], [place], [place], [address] Phone [phone], Fax [phone]

## 2023-08-12 ENCOUNTER — Encounter: Payer: Self-pay | Admitting: Cardiology

## 2023-08-12 ENCOUNTER — Ambulatory Visit: Payer: 59 | Attending: Cardiology | Admitting: Cardiology

## 2023-08-12 VITALS — BP 110/80 | HR 62 | Ht 71.0 in | Wt 246.0 lb

## 2023-08-12 DIAGNOSIS — I10 Essential (primary) hypertension: Secondary | ICD-10-CM | POA: Diagnosis not present

## 2023-08-12 DIAGNOSIS — I480 Paroxysmal atrial fibrillation: Secondary | ICD-10-CM

## 2023-08-12 NOTE — Patient Instructions (Signed)

## 2023-10-28 ENCOUNTER — Ambulatory Visit (INDEPENDENT_AMBULATORY_CARE_PROVIDER_SITE_OTHER)

## 2023-10-28 ENCOUNTER — Encounter: Payer: Self-pay | Admitting: Podiatrist

## 2023-10-28 ENCOUNTER — Ambulatory Visit: Admitting: Podiatrist

## 2023-10-28 ENCOUNTER — Ambulatory Visit: Admitting: Podiatry

## 2023-10-28 DIAGNOSIS — M79671 Pain in right foot: Secondary | ICD-10-CM | POA: Diagnosis not present

## 2023-10-28 DIAGNOSIS — M79672 Pain in left foot: Secondary | ICD-10-CM

## 2023-10-28 DIAGNOSIS — M2142 Flat foot [pes planus] (acquired), left foot: Secondary | ICD-10-CM | POA: Diagnosis not present

## 2023-10-28 DIAGNOSIS — M2141 Flat foot [pes planus] (acquired), right foot: Secondary | ICD-10-CM

## 2023-10-28 NOTE — Progress Notes (Signed)
 Chief Complaint  Patient presents with   Foot Pain    " I had seen my pcp recently for a physical and I thought I had some knee pain and I am not sure if both are causing the pain" and " also my left big toe nail may  be fungus"     HPI: Patient is 53 y.o. male who presents today for flattening of the arches and generalized foot and right knee discomfort.  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   He currently wears [employer] running shoes.  Also relates his left great toenail is thick- he took an oral antifungal pill in the past and relates it didn't help.    Patient Active Problem List   Diagnosis Date Noted   GERD (gastroesophageal reflux disease) 03/15/2018   Palpitations 01/16/2018   Paroxysmal atrial fibrillation (HCC) 05/30/2017   Atypical chest pain 05/30/2017   Essential hypertension 05/30/2017   Urinary retention with incomplete bladder emptying 04/18/2013   Ileus, postoperative (HCC) 04/18/2013   Appendicitis, acute 04/15/2013    Current Outpatient Medications on File Prior to Visit  Medication Sig Dispense Refill   acetaminophen (TYLENOL) 325 MG tablet Take 650 mg by mouth every 4 (four) hours as needed.      aspirin EC 81 MG tablet Take 1 tablet (81 mg total) daily by mouth.     cetirizine (ZYRTEC) 10 MG tablet Take 10 mg by mouth daily.     diltiazem (CARDIZEM) 30 MG tablet Take 1 tablet (30 mg total) by mouth daily as needed (afib). 10 tablet 6   fluticasone (FLONASE) 50 MCG/ACT nasal spray SMARTSIG:2 Spray(s) Both Nares Every Night     lisinopril (PRINIVIL,ZESTRIL) 10 MG tablet Take 10 mg by mouth daily.     nitroGLYCERIN (NITRODUR - DOSED IN MG/24 HR) 0.4 mg/hr patch SMARTSIG:Topical     Omega-3 Fatty Acids (FISH OIL PO) Take by mouth as directed.     rosuvastatin (CRESTOR) 10 MG tablet Take 10 mg by mouth daily.     No current facility-administered medications on file prior to visit.    Allergies  Allergen Reactions   Carbamazepine Other (See  Comments)    anemia    Review of Systems No fevers, chills, nausea, muscle aches, no difficulty breathing, no calf pain, no chest pain or shortness of breath.   Physical Exam  GENERAL APPEARANCE: Alert, conversant. Appropriately groomed. No acute distress.   VASCULAR: Pedal pulses palpable 2/4 DP and 2/4 PT bilateral.  Capillary refill time is immediate to all digits,  Proximal to distal cooling is warm to warm.  Digital perfusion adequate.   NEUROLOGIC: sensation is intact to 5.07 monofilament at 5/5 sites bilateral.  Light touch is intact bilateral, vibratory sensation intact bilateral  MUSCULOSKELETAL: acceptable muscle strength, tone and stability bilateral.  Pes planus noted left greater than right.  decrease arch height and prominent talar head noted with standing.  Too many toes sign is see as well and calcaneus is in a valgus position on standing.  Left greater than right.  Some generalized lateral discomfort noted along the peroneal tendon complex.  Pain plantar right heel noted.  Subjective pain right knee reported.    DERMATOLOGIC: skin is warm, supple, and dry.  Color, texture, and turgor of skin within normal limits.  No open wounds are noted.  No preulcerative lesions are seen.  Digital nails are asymptomatic.    Xray. 3 views bilateral feet show decrease arch  height and decrease calcaneal inclination angle noted left greater than right.  Slight hallux abducto valgus deformity present bilateral. Otherwise no acute osseous abnormalities present.   Assessment     ICD-10-CM   1. Bilateral foot pain  M79.671 DG Foot Complete Right   M79.672 DG Foot Complete Left    2. Pes planus of both feet  M21.41    M21.42        Plan  Discussed treatment options.  Discussed the positive long term benefits of orthotics- he will be set up with an appointment to see Bethann Berkshire for orthotics.  Discussed the left hallux nail is likely from damage to the underlying root of the nail and the  only treatment would be filing down the nail or permanent removal.  He will consider these options in the future.  Return for orthotics.

## 2023-12-22 ENCOUNTER — Telehealth: Payer: Self-pay

## 2023-12-22 NOTE — Telephone Encounter (Signed)
 LVM to cancel DM shoe appointment.

## 2023-12-23 ENCOUNTER — Other Ambulatory Visit

## 2023-12-28 ENCOUNTER — Ambulatory Visit: Admitting: Podiatry

## 2023-12-31 ENCOUNTER — Other Ambulatory Visit (HOSPITAL_COMMUNITY): Payer: Self-pay | Admitting: Sports Medicine

## 2023-12-31 ENCOUNTER — Ambulatory Visit: Admitting: Podiatry

## 2023-12-31 ENCOUNTER — Encounter: Payer: Self-pay | Admitting: Podiatry

## 2023-12-31 DIAGNOSIS — M722 Plantar fascial fibromatosis: Secondary | ICD-10-CM | POA: Diagnosis not present

## 2023-12-31 DIAGNOSIS — M2141 Flat foot [pes planus] (acquired), right foot: Secondary | ICD-10-CM | POA: Diagnosis not present

## 2023-12-31 DIAGNOSIS — M2142 Flat foot [pes planus] (acquired), left foot: Secondary | ICD-10-CM | POA: Diagnosis not present

## 2023-12-31 DIAGNOSIS — M7712 Lateral epicondylitis, left elbow: Secondary | ICD-10-CM

## 2023-12-31 DIAGNOSIS — M7711 Lateral epicondylitis, right elbow: Secondary | ICD-10-CM

## 2023-12-31 NOTE — Progress Notes (Signed)
 Presents for follow-up pes planus and plantar fasciitis bilaterally.  We discussed orthotics.  He saw Dr. Con Decant several months ago.  They talked about this.  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  He wears [employer] at work.  The foot starts to start getting knee pain is also.   Physical exam:  General appearance: Pleasant, and in no acute distress. AOx3.  Vascular: Pedal pulses: DP 2/4 bilaterally, PT 2/4 bilaterally.  Mild edema lower legs bilaterally. Capillary fill time immediate.  Neurological: Light touch intact feet bilaterally.  Normal Achilles reflex bilaterally.  No clonus or spasticity noted.  Negative Tinel's sign tarsal tunnel and porta pedis bilaterally  Dermatologic:   Skin normal temperature bilaterally.  Skin normal color, tone, and texture bilaterally.   Musculoskeletal: Tenderness along the plantar fascial band in the middle one third.  Some tenderness in posterior tibial tendon along the medial left.  No tenderness throughout range of motion of the subtalar joint or ankle.  Pes planus left worse than right.    Diagnosis: 1.  Plantar fasciitis bilaterally. 2.  Pes planus bilaterally.  With pain  Plan: -Established office visit for evaluation and management level 3. - Discussed with him proper shoes.  Discussed icing and stretching. Discussed premade foot orthoses versus custom molded orthoses.  Discussed benefits of each.  Will try some premade ones today and see how he does with these a month if he still having problems with the orthoses.  Return 2 months follow-up possible custom orthotics

## 2024-01-18 ENCOUNTER — Ambulatory Visit (HOSPITAL_COMMUNITY)
Admission: RE | Admit: 2024-01-18 | Discharge: 2024-01-18 | Disposition: A | Discharge status: Home / Self Care | Source: Ambulatory Visit | Attending: Sports Medicine | Admitting: Sports Medicine

## 2024-01-18 DIAGNOSIS — M7712 Lateral epicondylitis, left elbow: Secondary | ICD-10-CM

## 2024-01-18 DIAGNOSIS — M7711 Lateral epicondylitis, right elbow: Secondary | ICD-10-CM | POA: Insufficient documentation

## 2024-03-01 ENCOUNTER — Encounter: Payer: Self-pay | Admitting: Podiatry

## 2024-03-01 ENCOUNTER — Ambulatory Visit: Admitting: Podiatry

## 2024-03-01 DIAGNOSIS — M722 Plantar fascial fibromatosis: Secondary | ICD-10-CM

## 2024-03-01 MED ORDER — METHYLPREDNISOLONE 4 MG PO TBPK: ORAL_TABLET | ORAL | 0 refills | Status: AC

## 2024-03-01 NOTE — Progress Notes (Signed)
 Patient presents today with follow-up plantar fasciitis and arch pain bilaterally.  Might be doing a little bit better.  He has been doing a lot of work and lifting.  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   Physical exam:  General appearance: Pleasant, and in no acute distress. AOx3.  Vascular: Pedal pulses: DP 2/4 bilaterally, PT 2/4 bilaterally.  Mild edema lower legs bilaterally. Capillary fill time immediate 1 through 5 bilaterally.  Neurological: Negative Tinel's sign tarsal tunnel and porta pedis bilaterally  Dermatologic:   Skin normal temperature bilaterally.  Skin normal color, tone, and texture bilaterally.   Musculoskeletal: Palpation along the plantar fascia from the toes to heel bilaterally.  No fibromas noted.    Diagnosis: 1.  Plantar fasciitis bilaterally  Plan: -Established office visit for evaluation and management.   -Got some help with premade orthotics.  Would like to do custom orthotics.  Pain along the arches on the plantar aspect of the foot bilaterally -Consult today for casting for orthotics -Rx Medrol  Dosepak take as directed  Return 8 weeks follow-up

## 2024-03-16 ENCOUNTER — Telehealth: Payer: Self-pay

## 2024-03-16 NOTE — Telephone Encounter (Signed)
 CALLED TO SCHED ORTHotic fitting/ pu.. pt said he will call back to schedule

## 2024-04-18 ENCOUNTER — Ambulatory Visit (INDEPENDENT_AMBULATORY_CARE_PROVIDER_SITE_OTHER)

## 2024-04-18 DIAGNOSIS — M79671 Pain in right foot: Secondary | ICD-10-CM | POA: Diagnosis not present

## 2024-04-18 DIAGNOSIS — M2141 Flat foot [pes planus] (acquired), right foot: Secondary | ICD-10-CM

## 2024-04-18 DIAGNOSIS — M722 Plantar fascial fibromatosis: Secondary | ICD-10-CM

## 2024-04-18 DIAGNOSIS — M2142 Flat foot [pes planus] (acquired), left foot: Secondary | ICD-10-CM

## 2024-04-18 DIAGNOSIS — M79672 Pain in left foot: Secondary | ICD-10-CM | POA: Diagnosis not present

## 2024-04-26 NOTE — Progress Notes (Signed)
 Patient presents today to pick up custom molded foot orthotics, diagnosed with PF by Dr. Augusta.   Orthotics were dispensed and fit was satisfactory. Reviewed instructions for break-in and wear. Written instructions given to patient.  Patient will follow up as needed.   Lolita Schultze Cped

## 2024-04-27 ENCOUNTER — Ambulatory Visit: Admitting: Podiatry

## 2024-04-27 ENCOUNTER — Encounter: Payer: Self-pay | Admitting: Podiatry

## 2024-04-27 DIAGNOSIS — M722 Plantar fascial fibromatosis: Secondary | ICD-10-CM

## 2024-04-27 MED ORDER — TRIAMCINOLONE ACETONIDE 10 MG/ML IJ SUSP
10.0000 mg | Freq: Once | INTRAMUSCULAR | Status: AC
  Administered 2024-04-27: 10 mg

## 2024-04-27 NOTE — Progress Notes (Signed)
 Still complaining of pain at the heel right greater than left.  Some tenderness in dorsal midfoot left.  Right is the worst 1 at this point.   Physical exam:  General appearance: Pleasant, and in no acute distress. AOx3.  Vascular: Pedal pulses: DP 2/4 bilaterally, PT 2/4 bilaterally. Mild edema lower legs bilaterally. Capillary fill time immediatre b/l.  Neurological: Light touch intact feet bilaterally.  Normal Achilles reflex bilaterally.  No clonus or spasticity noted.  Negative Tinel sign tarsal tunnel porta pedis bilaterally  Dermatologic:   Skin normal temperature bilaterally.  Skin normal color, tone, and texture bilaterally.   Musculoskeletal: Tenderness plantar medial aspect calcaneus right greater than left.  No tenderness lateral compression of calcaneus bilaterally.  Some tenderness over the first TMT on the left foot.   Diagnosis: 1.  Plantar fasciitis right greater than left.  Plan: -Will try an injection of the plantar fascia on the right.  Will see how responds to this. -injected 3cc 2:1 mixture 0.5 cc Marcaine :Kenolog 10mg /86ml at medial origin plantar fascial band at medial calcaneal tubercle right.     Return 2 weeks follow-up injection plantar fascia right

## 2024-05-17 ENCOUNTER — Ambulatory Visit: Admitting: Podiatry

## 2024-05-17 DIAGNOSIS — M722 Plantar fascial fibromatosis: Secondary | ICD-10-CM | POA: Diagnosis not present

## 2024-05-17 MED ORDER — TRIAMCINOLONE ACETONIDE 40 MG/ML IJ SUSP
40.0000 mg | Freq: Once | INTRAMUSCULAR | Status: AC
  Administered 2024-05-17: 40 mg

## 2024-05-17 NOTE — Progress Notes (Signed)
 Patient presents still complaining of pain in the right heel.  Says probably about 70% better.  Left foot is doing well.   Physical exam:  General appearance: Pleasant, and in no acute distress. AOx3.  Vascular: Pedal pulses: DP 2/4 bilaterally, PT 2/4 bilaterally.  No edema lower legs bilaterally. Capillary fill time immediate.  Neurological: Light touch intact feet bilaterally.  Normal Achilles reflex bilaterally.  No clonus or spasticity noted.  Negative Tinel sign tarsal tunnel and porta pedis right  Dermatologic:   Skin normal temperature bilaterally.  Skin normal color, tone, and texture bilaterally.   Musculoskeletal: Tenderness plantar medial aspect calcaneus at the medial plantar calcaneal tubercle.  No tenderness to lateral compression of the calcaneus.    Diagnosis: 1.  Plantar fasciitis right  Plan: -Will try second injection of the plantar fascia right today. -injected 3cc 2:1 mixture 0.5 cc Marcaine :Kenolog 10mg /3ml at plantar fascia origin medial plantar calcaneal tubercle right.     Return 2 weeks follow-up injection right plantar fascia

## 2024-06-02 ENCOUNTER — Ambulatory Visit: Admitting: Podiatry

## 2024-07-28 ENCOUNTER — Encounter: Payer: Self-pay | Admitting: Cardiology
